# Patient Record
Sex: Male | Born: 1955 | Race: White | Hispanic: No | Marital: Single | Smoking: Current every day smoker
Health system: Southern US, Community
[De-identification: ages and names within clinical notes are randomized; demographics above are authoritative.]

## PROBLEM LIST (undated history)

## (undated) DIAGNOSIS — G621 Alcoholic polyneuropathy: Secondary | ICD-10-CM

## (undated) DIAGNOSIS — D696 Thrombocytopenia, unspecified: Secondary | ICD-10-CM

## (undated) DIAGNOSIS — F1020 Alcohol dependence, uncomplicated: Secondary | ICD-10-CM

## (undated) DIAGNOSIS — I219 Acute myocardial infarction, unspecified: Secondary | ICD-10-CM

## (undated) DIAGNOSIS — R768 Other specified abnormal immunological findings in serum: Secondary | ICD-10-CM

## (undated) DIAGNOSIS — J45909 Unspecified asthma, uncomplicated: Secondary | ICD-10-CM

## (undated) DIAGNOSIS — K7031 Alcoholic cirrhosis of liver with ascites: Secondary | ICD-10-CM

## (undated) DIAGNOSIS — I2510 Atherosclerotic heart disease of native coronary artery without angina pectoris: Secondary | ICD-10-CM

## (undated) HISTORY — DX: Alcoholic cirrhosis of liver with ascites: K70.31

## (undated) HISTORY — PX: CARDIAC CATHETERIZATION: SHX172

## (undated) HISTORY — PX: CARDIAC SURGERY: SHX584

## (undated) HISTORY — DX: Alcoholic polyneuropathy: G62.1

## (undated) HISTORY — DX: Other specified abnormal immunological findings in serum: R76.8

## (undated) HISTORY — PX: HERNIA REPAIR: SHX51

## (undated) HISTORY — DX: Alcohol dependence, uncomplicated: F10.20

---

## 2008-04-23 ENCOUNTER — Emergency Department: Payer: Self-pay | Admitting: Emergency Medicine

## 2011-03-31 ENCOUNTER — Emergency Department: Payer: Self-pay | Admitting: Emergency Medicine

## 2019-06-30 DIAGNOSIS — R197 Diarrhea, unspecified: Secondary | ICD-10-CM | POA: Diagnosis not present

## 2019-06-30 DIAGNOSIS — R638 Other symptoms and signs concerning food and fluid intake: Secondary | ICD-10-CM | POA: Diagnosis not present

## 2019-06-30 DIAGNOSIS — R3915 Urgency of urination: Secondary | ICD-10-CM | POA: Diagnosis not present

## 2020-03-02 DIAGNOSIS — Z03818 Encounter for observation for suspected exposure to other biological agents ruled out: Secondary | ICD-10-CM | POA: Diagnosis not present

## 2020-03-02 DIAGNOSIS — Z20822 Contact with and (suspected) exposure to covid-19: Secondary | ICD-10-CM | POA: Diagnosis not present

## 2020-10-09 ENCOUNTER — Emergency Department: Payer: BC Managed Care – PPO

## 2020-10-09 ENCOUNTER — Inpatient Hospital Stay
Admission: EM | Admit: 2020-10-09 | Discharge: 2020-10-11 | DRG: 433 | Disposition: A | Discharge status: Home / Self Care | Payer: BC Managed Care – PPO | Attending: Internal Medicine | Admitting: Internal Medicine

## 2020-10-09 ENCOUNTER — Other Ambulatory Visit: Payer: Self-pay

## 2020-10-09 DIAGNOSIS — I493 Ventricular premature depolarization: Secondary | ICD-10-CM | POA: Diagnosis present

## 2020-10-09 DIAGNOSIS — K7031 Alcoholic cirrhosis of liver with ascites: Principal | ICD-10-CM

## 2020-10-09 DIAGNOSIS — D696 Thrombocytopenia, unspecified: Secondary | ICD-10-CM | POA: Diagnosis not present

## 2020-10-09 DIAGNOSIS — E871 Hypo-osmolality and hyponatremia: Secondary | ICD-10-CM | POA: Diagnosis not present

## 2020-10-09 DIAGNOSIS — D539 Nutritional anemia, unspecified: Secondary | ICD-10-CM | POA: Diagnosis present

## 2020-10-09 DIAGNOSIS — I2510 Atherosclerotic heart disease of native coronary artery without angina pectoris: Secondary | ICD-10-CM | POA: Diagnosis present

## 2020-10-09 DIAGNOSIS — Z955 Presence of coronary angioplasty implant and graft: Secondary | ICD-10-CM | POA: Diagnosis not present

## 2020-10-09 DIAGNOSIS — J45909 Unspecified asthma, uncomplicated: Secondary | ICD-10-CM | POA: Diagnosis not present

## 2020-10-09 DIAGNOSIS — R188 Other ascites: Secondary | ICD-10-CM | POA: Diagnosis not present

## 2020-10-09 DIAGNOSIS — I252 Old myocardial infarction: Secondary | ICD-10-CM | POA: Diagnosis not present

## 2020-10-09 DIAGNOSIS — K7010 Alcoholic hepatitis without ascites: Secondary | ICD-10-CM | POA: Diagnosis present

## 2020-10-09 DIAGNOSIS — E876 Hypokalemia: Secondary | ICD-10-CM | POA: Diagnosis not present

## 2020-10-09 DIAGNOSIS — N62 Hypertrophy of breast: Secondary | ICD-10-CM | POA: Diagnosis present

## 2020-10-09 DIAGNOSIS — F1020 Alcohol dependence, uncomplicated: Secondary | ICD-10-CM | POA: Diagnosis not present

## 2020-10-09 DIAGNOSIS — J9811 Atelectasis: Secondary | ICD-10-CM | POA: Diagnosis not present

## 2020-10-09 DIAGNOSIS — E538 Deficiency of other specified B group vitamins: Secondary | ICD-10-CM | POA: Diagnosis not present

## 2020-10-09 DIAGNOSIS — R270 Ataxia, unspecified: Secondary | ICD-10-CM | POA: Diagnosis present

## 2020-10-09 DIAGNOSIS — R0602 Shortness of breath: Secondary | ICD-10-CM | POA: Diagnosis not present

## 2020-10-09 DIAGNOSIS — R000 Tachycardia, unspecified: Secondary | ICD-10-CM | POA: Diagnosis present

## 2020-10-09 DIAGNOSIS — Z20822 Contact with and (suspected) exposure to covid-19: Secondary | ICD-10-CM | POA: Diagnosis present

## 2020-10-09 DIAGNOSIS — K760 Fatty (change of) liver, not elsewhere classified: Secondary | ICD-10-CM | POA: Diagnosis present

## 2020-10-09 DIAGNOSIS — E512 Wernicke's encephalopathy: Secondary | ICD-10-CM

## 2020-10-09 DIAGNOSIS — R29818 Other symptoms and signs involving the nervous system: Secondary | ICD-10-CM | POA: Diagnosis not present

## 2020-10-09 HISTORY — DX: Unspecified asthma, uncomplicated: J45.909

## 2020-10-09 HISTORY — DX: Acute myocardial infarction, unspecified: I21.9

## 2020-10-09 LAB — RESP PANEL BY RT-PCR (FLU A&B, COVID) ARPGX2
Influenza A by PCR: NEGATIVE
Influenza B by PCR: NEGATIVE
SARS Coronavirus 2 by RT PCR: NEGATIVE

## 2020-10-09 LAB — CBC
HCT: 32.4 % — ABNORMAL LOW (ref 39.0–52.0)
Hemoglobin: 12 g/dL — ABNORMAL LOW (ref 13.0–17.0)
MCH: 39.9 pg — ABNORMAL HIGH (ref 26.0–34.0)
MCHC: 37 g/dL — ABNORMAL HIGH (ref 30.0–36.0)
MCV: 107.6 fL — ABNORMAL HIGH (ref 80.0–100.0)
Platelets: 70 10*3/uL — ABNORMAL LOW (ref 150–400)
RBC: 3.01 MIL/uL — ABNORMAL LOW (ref 4.22–5.81)
RDW: 12.5 % (ref 11.5–15.5)
WBC: 5.5 10*3/uL (ref 4.0–10.5)
nRBC: 0 % (ref 0.0–0.2)

## 2020-10-09 LAB — BRAIN NATRIURETIC PEPTIDE: B Natriuretic Peptide: 143.1 pg/mL — ABNORMAL HIGH (ref 0.0–100.0)

## 2020-10-09 LAB — BASIC METABOLIC PANEL
Anion gap: 15 (ref 5–15)
BUN: 18 mg/dL (ref 8–23)
CO2: 34 mmol/L — ABNORMAL HIGH (ref 22–32)
Calcium: 8.3 mg/dL — ABNORMAL LOW (ref 8.9–10.3)
Chloride: 80 mmol/L — ABNORMAL LOW (ref 98–111)
Creatinine, Ser: 1.11 mg/dL (ref 0.61–1.24)
GFR, Estimated: >60 mL/min (ref >60)
Glucose, Bld: 126 mg/dL — ABNORMAL HIGH (ref 70–99)
Potassium: 2.5 mmol/L — CL (ref 3.5–5.1)
Sodium: 129 mmol/L — ABNORMAL LOW (ref 135–145)

## 2020-10-09 LAB — TROPONIN I (HIGH SENSITIVITY)
Troponin I (High Sensitivity): 32 ng/L — ABNORMAL HIGH (ref <18)
Troponin I (High Sensitivity): 31 ng/L — ABNORMAL HIGH (ref <18)

## 2020-10-09 LAB — PROTIME-INR
INR: 1.1 (ref 0.8–1.2)
Prothrombin Time: 13.8 seconds (ref 11.4–15.2)

## 2020-10-09 LAB — APTT: aPTT: 27 seconds (ref 24–36)

## 2020-10-09 LAB — MAGNESIUM: Magnesium: 1.7 mg/dL (ref 1.7–2.4)

## 2020-10-09 LAB — HEPATIC FUNCTION PANEL
ALT: 49 U/L — ABNORMAL HIGH (ref 0–44)
AST: 96 U/L — ABNORMAL HIGH (ref 15–41)
Albumin: 2.4 g/dL — ABNORMAL LOW (ref 3.5–5.0)
Alkaline Phosphatase: 141 U/L — ABNORMAL HIGH (ref 38–126)
Bilirubin, Direct: 0.7 mg/dL — ABNORMAL HIGH (ref 0.0–0.2)
Indirect Bilirubin: 1.6 mg/dL — ABNORMAL HIGH (ref 0.3–0.9)
Total Bilirubin: 2.3 mg/dL — ABNORMAL HIGH (ref 0.3–1.2)
Total Protein: 5.5 g/dL — ABNORMAL LOW (ref 6.5–8.1)

## 2020-10-09 LAB — FOLATE: Folate: 5.3 ng/mL — ABNORMAL LOW (ref >5.9)

## 2020-10-09 LAB — ETHANOL: Alcohol, Ethyl (B): <10 mg/dL (ref <10)

## 2020-10-09 LAB — LIPASE, BLOOD: Lipase: 56 U/L — ABNORMAL HIGH (ref 11–51)

## 2020-10-09 MED ORDER — POTASSIUM CHLORIDE 10 MEQ/100ML IV SOLN
10.0000 meq | INTRAVENOUS | Status: AC
  Administered 2020-10-09 – 2020-10-10 (×4): 10 meq via INTRAVENOUS
  Filled 2020-10-09 (×3): qty 100

## 2020-10-09 MED ORDER — THIAMINE HCL 100 MG/ML IJ SOLN
500.0000 mg | Freq: Once | INTRAMUSCULAR | Status: DC
  Filled 2020-10-09: qty 6

## 2020-10-09 MED ORDER — FOLIC ACID 1 MG PO TABS
1.0000 mg | ORAL_TABLET | Freq: Once | ORAL | Status: AC
  Administered 2020-10-09: 1 mg via ORAL
  Filled 2020-10-09: qty 1

## 2020-10-09 MED ORDER — MAGNESIUM SULFATE 2 GM/50ML IV SOLN
2.0000 g | Freq: Once | INTRAVENOUS | Status: AC
  Administered 2020-10-09: 2 g via INTRAVENOUS
  Filled 2020-10-09: qty 50

## 2020-10-09 MED ORDER — THIAMINE HCL 100 MG/ML IJ SOLN
500.0000 mg | Freq: Once | INTRAVENOUS | Status: AC
  Administered 2020-10-10: 500 mg via INTRAVENOUS
  Filled 2020-10-09: qty 5

## 2020-10-09 MED ORDER — FOLIC ACID 1 MG PO TABS
1.0000 mg | ORAL_TABLET | Freq: Every day | ORAL | Status: DC
  Administered 2020-10-10 – 2020-10-11 (×2): 1 mg via ORAL
  Filled 2020-10-09 (×2): qty 1

## 2020-10-09 MED ORDER — IOHEXOL 350 MG/ML SOLN
80.0000 mL | Freq: Once | INTRAVENOUS | Status: AC | PRN
  Administered 2020-10-09: 80 mL via INTRAVENOUS

## 2020-10-09 MED ORDER — THIAMINE HCL 100 MG PO TABS
100.0000 mg | ORAL_TABLET | Freq: Every day | ORAL | Status: DC
  Administered 2020-10-10 – 2020-10-11 (×2): 100 mg via ORAL
  Filled 2020-10-09 (×2): qty 1

## 2020-10-09 MED ORDER — POTASSIUM CHLORIDE 10 MEQ/100ML IV SOLN
10.0000 meq | INTRAVENOUS | Status: AC
  Administered 2020-10-09 (×2): 10 meq via INTRAVENOUS
  Filled 2020-10-09 (×3): qty 100

## 2020-10-09 MED ORDER — ONDANSETRON HCL 4 MG/2ML IJ SOLN: 4.0000 mg | Freq: Four times a day (QID) | INTRAMUSCULAR | Status: DC | PRN

## 2020-10-09 MED ORDER — MAGNESIUM OXIDE -MG SUPPLEMENT 400 (240 MG) MG PO TABS
400.0000 mg | ORAL_TABLET | Freq: Two times a day (BID) | ORAL | Status: DC
  Administered 2020-10-09 – 2020-10-11 (×4): 400 mg via ORAL
  Filled 2020-10-09 (×4): qty 1

## 2020-10-09 MED ORDER — ONDANSETRON HCL 4 MG PO TABS: 4.0000 mg | ORAL_TABLET | Freq: Four times a day (QID) | ORAL | Status: DC | PRN

## 2020-10-09 NOTE — ED Notes (Signed)
Patient transported to X-ray 

## 2020-10-09 NOTE — ED Triage Notes (Signed)
Pt to ER via POV, reports he has had four heart attacks and he feels like he is having another one. Reports bilateral foot and hand numbness and tingling, being unable to keep down anything PO, and shortness of breath. Denies chest pain or discomfort. Reports difficulty seeing after going to the eye doctor this week, reports his eyes went black after dilation and he has poor vision at night.   States symptoms started 3-4 days ago.

## 2020-10-09 NOTE — ED Provider Notes (Signed)
[facility] Emergency Department Provider Note  ____________________________________________   Event Date/Time   First MD Initiated Contact with Patient 10/09/20 1504     (approximate)  I have reviewed the triage vital signs and the nursing notes.   HISTORY  Chief Complaint Shortness of Breath    HPI Paul Willis is a 65 y.o. male with asthma, prior heart attack who comes in with concerns for heart attack.  Patient reports a history of for heart attacks.  However he states that he is never had any stents put in and that 1 time they did the catheterization and just opened up a vessel without any other interventions.  He denies being on any medications at this time for his heart or having any history of heart surgery.  He reports that he just feels very short of breath when he has to walk upstairs that he feels really short of breath, constant, worse with walking, better at rest.  Denies any chest pain.  Reports a tingling sensation in his bilateral hands and bilateral feet.  States that every time he tries to eat he throws up.  Also reports that his vision has changed after having his eyes dilated and see the eye doctor where all the lights are appearing yellow rather than white.  Patient does admit to occasional alcohol use.  States that he used to have a problem with drinking but he states that he had quit a few months ago and started to drink intermittently again but has not had a drink for 4 days.  Denies any drugs.     Past Medical History:  Diagnosis Date   Acute MI (HCC)    Asthma     There are no problems to display for this patient.   Past Surgical History:  Procedure Laterality Date   CARDIAC SURGERY      Prior to Admission medications   Not on File    Allergies Patient has no allergy information on record.  No family history on file.  Social History Alcohol use, no drug   Review of Systems Constitutional: No fever/chills Eyes: No  visual changes. ENT: No sore throat. Cardiovascular: No chest pain Respiratory: Positive for SOB Gastrointestinal: No abdominal pain.  Positive for nausea, vomiting genitourinary: Negative for dysuria. Musculoskeletal: Negative for back pain. Skin: Negative for rash. Neurological: Negative for headaches, focal weakness.  Positive for tingling sensation in his hands and feet All other ROS negative ____________________________________________   PHYSICAL EXAM:  VITAL SIGNS: ED Triage Vitals [10/09/20 1445]  Enc Vitals Group     BP 105/65     Pulse Rate (!) 105     Resp 19     Temp 98.6 F (37 C)     Temp Source Oral     SpO2 100 %     Weight      Height 5\' 9"  (1.753 m)     Head Circumference      Peak Flow      Pain Score 0     Pain Loc      Pain Edu?      Excl. in GC?     Constitutional: Alert and oriented. Well appearing and in no acute distress. Eyes: Conjunctivae are normal. EOMI. pupils reactive bilaterally Head: Atraumatic. Nose: No congestion/rhinnorhea. Mouth/Throat: Mucous membranes are moist.   Neck: No stridor. Trachea Midline. FROM Cardiovascular: Tachycardia, regular rhythm. Grossly normal heart sounds.  Good peripheral circulation. Respiratory: Clear lungs, no increased work of breathing  Gastrointestinal: Soft and nontender. No distention. No abdominal bruits.  Musculoskeletal:  No joint effusions.  His fingers do look a little bit pale at the fingertips but he is unsure if that is his baseline.  He is got 1+ edema in his bilateral legs Neurologic:  Normal speech and language. No gross focal neurologic deficits are appreciated.  Equal strength in arms and legs but reports some tingling sensation in his hands and his feet.  Skin:  Skin is warm, dry and intact. No rash noted.  Scattered bruising noted on arms that patient states that just come up spontaneously Psychiatric: Mood and affect are normal. Speech and behavior are normal. GU: Deferred    ____________________________________________   LABS (all labs ordered are listed, but only abnormal results are displayed)  Labs Reviewed  CBC - Abnormal; Notable for the following components:      Result Value   RBC 3.01 (*)    Hemoglobin 12.0 (*)    HCT 32.4 (*)    MCV 107.6 (*)    MCH 39.9 (*)    MCHC 37.0 (*)    Platelets 70 (*)    All other components within normal limits  BASIC METABOLIC PANEL  TROPONIN I (HIGH SENSITIVITY)   ____________________________________________   ED ECG REPORT I, Concha Se, the attending physician, personally viewed and interpreted this ECG.  Sinus tachycardia rate of 110 without any ST elevations or T wave inversions, does have an occasional PVC, prolonged QTC ____________________________________________  RADIOLOGY Vela Prose, personally viewed and evaluated these images (plain radiographs) as part of my medical decision making, as well as reviewing the written report by the radiologist.  ED MD interpretation: No pneumonia  Official radiology report(s): DG Chest 2 View  Result Date: 10/09/2020 CLINICAL DATA:  Shortness of breath. EXAM: CHEST - 2 VIEW COMPARISON:  None. FINDINGS: The heart size and mediastinal contours are within normal limits. Both lungs are clear. The visualized skeletal structures are unremarkable. IMPRESSION: No active cardiopulmonary disease. Electronically Signed   By: Lupita Raider M.D.   On: 10/09/2020 15:18    ____________________________________________   PROCEDURES  Procedure(s) performed (including Critical Care):  .1-3 Lead EKG Interpretation Performed by: Concha Se, MD Authorized by: Concha Se, MD     Interpretation: abnormal     ECG rate:  100s   ECG rate assessment: tachycardic     Rhythm: sinus tachycardia     Ectopy: PVCs     Conduction: normal     ____________________________________________   INITIAL IMPRESSION / ASSESSMENT AND PLAN / ED COURSE   Paul Willis was  evaluated in Emergency Department on 10/09/2020 for the symptoms described in the history of present illness. He was evaluated in the context of the global COVID-19 pandemic, which necessitated consideration that the patient might be at risk for infection with the SARS-CoV-2 virus that causes COVID-19. Institutional protocols and algorithms that pertain to the evaluation of patients at risk for COVID-19 are in a state of rapid change based on information released by regulatory bodies including the CDC and federal and state organizations. These policies and algorithms were followed during the patient's care in the ED.     Is in with multiple symptoms including shortness of breath and leg swelling being the most significant on my examination.  He has normal oxygen levels but he is pretty tachycardic and concerned about the possibility of PE.  Will get CT scan to further evaluate.  However given all the  above nausea and vomiting with eating we will also get CT to make sure no evidence of any kind of cancer as well as CT head to evaluate for any Intracranial pathology especially with the numbness and tingling.  Labs are ordered to evaluate for any kind of liver cirrhosis, B12 deficiency, folate deficiency given his concern for alcohol use.  She will be kept on the cardiac monitor.  We will hold off on fluids at this time given my concern of the leg edema.   Patient's potassium is low at 2.5.  We will give 40 of IV potassium.  His chloride and sodium are also significantly low.  His hemoglobin is at 12 but he is an elevated MCV which could be consistent with B12 or folate deficiency.  His platelets are low at 70.  But his INR is normal which is reassuring.  CT imaging was reassuring although there is concern for new cirrhosis and ascites.  Patient denies having a history of cirrhosis.  I suspect this from his alcohol abuse.  I did alert him of this.  There is also concern for maybe a mass in his right breast I can  feel something palpated underneath his right nipple.  I recommend that he follow-up for outpatient ultrasound mammogram the patient expressed understanding.  With given all the tingling and numbness this could be from his folate is undetectable so I added on some folate.  However could be some Warnicke's given he does report some difficulties with ambulating secondary to all the numbness and tingling.  He not have any ocular findings but given the nutritional did deficiency and the ataxia we will start him on high-dose thiamine.  Magnesium is normal so do not need to replete that.  We will discussed the hospital team for admission.         ____________________________________________   FINAL CLINICAL IMPRESSION(S) / ED DIAGNOSES   Final diagnoses:  Alcoholic cirrhosis of liver with ascites (HCC)  Hypokalemia  Folate deficiency  Wernicke's disease     MEDICATIONS GIVEN DURING THIS VISIT:  Medications  potassium chloride 10 mEq in 100 mL IVPB (10 mEq Intravenous New Bag/Given 10/09/20 1742)  folic acid (FOLVITE) tablet 1 mg (has no administration in time range)  thiamine (B-1) injection 500 mg (has no administration in time range)  iohexol (OMNIPAQUE) 350 MG/ML injection 80 mL (80 mLs Intravenous Contrast Given 10/09/20 1720)     ED Discharge Orders     None        Note:  This document was prepared using Dragon voice recognition software and may include unintentional dictation errors.   Concha Se, MD 10/09/20 [phone]

## 2020-10-09 NOTE — H&P (Signed)
History and Physical   Paul Willis EGB:[id number] DOB: Jul 27, 1955 DOA: 10/09/2020  Referring MD/NP/PA: Dr. Fuller Plan  PCP: System, Provider Not In   Outpatient Specialists: None  Patient coming from: Home  Chief Complaint: Shortness of breath  HPI: Paul Willis is a 65 y.o. male with medical history significant of alcohol abuse, asthma, coronary artery disease who presents to the ER with some tingling sensations in his hands legs as well as shortness of breath.  Patient has history of coronary artery disease with cardiac cath before with stent.  Currently not on any medications.  Also history of asthma.  Patient came to the ER was seen and evaluated and appears to be having liver cirrhosis.  Patient said he quit drinking 4 days ago.  He previously quit drinking then went back to drinking.  He has since been getting exertional dyspnea.  Usually when he climbs up stairs.  He noticed increasing leg abdominal swellings.  He has been also feeling weak in general.  Patient also has noted visual changes lately.  At this point patient being admitted with alcoholic cirrhosis but also worrisome for early Warnicke's disease.  He was additionally noted to have significant hypokalemia with potassium of 2.5.  He is being admitted to the hospital for further evaluation and treatment.  ED Course: Temperature is 98.6 blood pressure 105/65, pulse 105 respirate 20 oxygen sat 88% on room air.  White count is 5.5 hemoglobin 12.0 platelet count of 70.  Sodium 129 potassium 2.5 chloride 80s CO2 34 BUN 18 creatinine 1.11 and calcium 8.3.  Total bilirubin of 2.3 lipase 56 alkaline phos of 141.  Albumin 2.4.  AST 96 ALT 49.  BNP 143.  COVID-19 is an influenza screen negative.  Alcohol level less than 10.  CT head without contrast is negative.  CT angiogram of of the chest showed no evidence of PE.  CT abdomen and pelvis shows profound hepatic steatosis with hypertrophy of the left hepatic lobe likely indicating the cirrhosis.   Also small amount of ascites.  Asymmetric right lateral regular nodular density suspicious cirrhosis  Review of Systems: As per HPI otherwise 10 point review of systems negative.    Past Medical History:  Diagnosis Date   Acute MI (HCC)    Asthma     Past Surgical History:  Procedure Laterality Date   CARDIAC SURGERY       has no history on file for tobacco use, alcohol use, and drug use.  Not on File  No family history on file.   Prior to Admission medications   Not on File    Physical Exam: Vitals:   10/09/20 1543 10/09/20 1630 10/09/20 1712 10/09/20 1744  BP: 124/67 122/60 119/68 110/64  Pulse: (!) 102 97 93 95  Resp: 16 20 20 12   Temp:      TempSrc:      SpO2: 100% 97% 98% 98%  Height:          Constitutional: Chronically ill looking, weak Vitals:   10/09/20 1543 10/09/20 1630 10/09/20 1712 10/09/20 1744  BP: 124/67 122/60 119/68 110/64  Pulse: (!) 102 97 93 95  Resp: 16 20 20 12   Temp:      TempSrc:      SpO2: 100% 97% 98% 98%  Height:       Eyes: PERRL, lids and conjunctivae jaundiced ENMT: Mucous membranes are moist. Posterior pharynx clear of any exudate or lesions.Normal dentition.  Neck: normal, supple, no masses, no thyromegaly Respiratory: clear  to auscultation bilaterally, no wheezing, diffuse bilateral crackles. Normal respiratory effort. No accessory muscle use.  Cardiovascular: Sinus tachycardia, no murmurs / rubs / gallops. No extremity edema. 2+ pedal pulses. No carotid bruits.  Abdomen: Distended, mild shifting but no tenderness, no masses palpated. No hepatosplenomegaly. Bowel sounds positive.  Musculoskeletal: no clubbing / cyanosis. No joint deformity upper and lower extremities. Good ROM, no contractures. Normal muscle tone.  Skin: no rashes, lesions, ulcers. No induration Neurologic: CN 2-12 grossly intact. Sensation intact, DTR normal. Strength 5/5 in all 4.  Psychiatric: Normal judgment and insight. Alert and oriented x 3. Normal  mood.     Labs on Admission: I have personally reviewed following labs and imaging studies  CBC: Recent Labs  Lab 10/09/20 1448  WBC 5.5  HGB 12.0*  HCT 32.4*  MCV 107.6*  PLT 70*   Basic Metabolic Panel: Recent Labs  Lab 10/09/20 1448  NA 129*  K 2.5*  CL 80*  CO2 34*  GLUCOSE 126*  BUN 18  CREATININE 1.11  CALCIUM 8.3*  MG 1.7   GFR: CrCl cannot be calculated (Unknown ideal weight.). Liver Function Tests: Recent Labs  Lab 10/09/20 1448  AST 96*  ALT 49*  ALKPHOS 141*  BILITOT 2.3*  PROT 5.5*  ALBUMIN 2.4*   Recent Labs  Lab 10/09/20 1448  LIPASE 56*   No results for input(s): AMMONIA in the last 168 hours. Coagulation Profile: Recent Labs  Lab 10/09/20 1531  INR 1.1   Cardiac Enzymes: No results for input(s): CKTOTAL, CKMB, CKMBINDEX, TROPONINI in the last 168 hours. BNP (last 3 results) No results for input(s): PROBNP in the last 8760 hours. HbA1C: No results for input(s): HGBA1C in the last 72 hours. CBG: No results for input(s): GLUCAP in the last 168 hours. Lipid Profile: No results for input(s): CHOL, HDL, LDLCALC, TRIG, CHOLHDL, LDLDIRECT in the last 72 hours. Thyroid Function Tests: No results for input(s): TSH, T4TOTAL, FREET4, T3FREE, THYROIDAB in the last 72 hours. Anemia Panel: Recent Labs    10/09/20 1531  FOLATE 5.3*   Urine analysis: No results found for: COLORURINE, APPEARANCEUR, LABSPEC, PHURINE, GLUCOSEU, HGBUR, BILIRUBINUR, KETONESUR, PROTEINUR, UROBILINOGEN, NITRITE, LEUKOCYTESUR Sepsis Labs: @LABRCNTIP (procalcitonin:4,lacticidven:4) ) Recent Results (from the past 240 hour(s))  Resp Panel by RT-PCR (Flu A&B, Covid) Nasopharyngeal Swab     Status: None   Collection Time: 10/09/20  3:31 PM   Specimen: Nasopharyngeal Swab; Nasopharyngeal(NP) swabs in vial transport medium  Result Value Ref Range Status   SARS Coronavirus 2 by RT PCR NEGATIVE NEGATIVE Final    Comment: (NOTE) SARS-CoV-2 target nucleic acids are NOT  DETECTED.  The SARS-CoV-2 RNA is generally detectable in upper respiratory specimens during the acute phase of infection. The lowest concentration of SARS-CoV-2 viral copies this assay can detect is 138 copies/mL. A negative result does not preclude SARS-Cov-2 infection and should not be used as the sole basis for treatment or other patient management decisions. A negative result may occur with  improper specimen collection/handling, submission of specimen other than nasopharyngeal swab, presence of viral mutation(s) within the areas targeted by this assay, and inadequate number of viral copies(<138 copies/mL). A negative result must be combined with clinical observations, patient history, and epidemiological information. The expected result is Negative.  Fact Sheet for Patients:  10/11/20  Fact Sheet for Healthcare Providers:  BloggerCourse.com  This test is no t yet approved or cleared by the [employer] FDA and  has been authorized for detection and/or diagnosis of SARS-CoV-2 by  FDA under an Emergency Use Authorization (EUA). This EUA will remain  in effect (meaning this test can be used) for the duration of the COVID-19 declaration under Section 564(b)(1) of the Act, 21 U.S.C.section 360bbb-3(b)(1), unless the authorization is terminated  or revoked sooner.       Influenza A by PCR NEGATIVE NEGATIVE Final   Influenza B by PCR NEGATIVE NEGATIVE Final    Comment: (NOTE) The Xpert Xpress SARS-CoV-2/FLU/RSV plus assay is intended as an aid in the diagnosis of influenza from Nasopharyngeal swab specimens and should not be used as a sole basis for treatment. Nasal washings and aspirates are unacceptable for Xpert Xpress SARS-CoV-2/FLU/RSV testing.  Fact Sheet for Patients: BloggerCourse.com  Fact Sheet for Healthcare Providers: SeriousBroker.it  This test is not yet  approved or cleared by the [place] FDA and has been authorized for detection and/or diagnosis of SARS-CoV-2 by FDA under an Emergency Use Authorization (EUA). This EUA will remain in effect (meaning this test can be used) for the duration of the COVID-19 declaration under Section 564(b)(1) of the Act, 21 U.S.C. section 360bbb-3(b)(1), unless the authorization is terminated or revoked.  Performed at [facility], [address] [place], [place] [address]      Radiological Exams on Admission: DG Chest 2 View  Result Date: 10/09/2020 CLINICAL DATA:  Shortness of breath. EXAM: CHEST - 2 VIEW COMPARISON:  None. FINDINGS: The heart size and mediastinal contours are within normal limits. Both lungs are clear. The visualized skeletal structures are unremarkable. IMPRESSION: No active cardiopulmonary disease. Electronically Signed   By: Lupita Raider M.D.   On: 10/09/2020 15:18   CT HEAD WO CONTRAST ( )  Result Date: 10/09/2020 CLINICAL DATA:  Acute neuro deficit. EXAM: CT HEAD WITHOUT CONTRAST TECHNIQUE: Contiguous axial images were obtained from the base of the skull through the vertex without intravenous contrast. COMPARISON:  None. FINDINGS: Brain: No evidence of acute infarction, hemorrhage, hydrocephalus, extra-axial collection or mass lesion/mass effect. Vascular: No hyperdense vessel or unexpected calcification. Skull: Normal. Negative for fracture or focal lesion. Sinuses/Orbits: No acute finding. Other: None. IMPRESSION: No acute intracranial abnormality seen. Electronically Signed   By: Lupita Raider M.D.   On: 10/09/2020 17:46   CT Angio Chest PE W and/or Wo Contrast  Result Date: 10/09/2020 CLINICAL DATA:  PE suspected, high prob; Abdominal distension EXAM: CT ANGIOGRAPHY CHEST CT ABDOMEN AND PELVIS WITH CONTRAST TECHNIQUE: Multidetector CT imaging of the chest was performed using the standard protocol during bolus administration of intravenous contrast. Multiplanar  CT image reconstructions and MIPs were obtained to evaluate the vascular anatomy. Multidetector CT imaging of the abdomen and pelvis was performed using the standard protocol during bolus administration of intravenous contrast. CONTRAST:  49mL OMNIPAQUE IOHEXOL 350 MG/ML SOLN COMPARISON:  None. FINDINGS: CTA CHEST FINDINGS Cardiovascular: Satisfactory opacification of the pulmonary arteries to the segmental level. No evidence of pulmonary embolism. Normal heart size. No pericardial effusion. LEFT-sided coronary artery atherosclerotic calcifications. Mediastinum/Nodes: No axillary or mediastinal adenopathy. Thyroid is unremarkable. Lungs/Pleura: Multifocal endobronchial and tracheal debris with mild bronchial wall thickening and plugging at the bases with mild RIGHT lower lobe atelectasis. Mild centrilobular emphysema. No pleural effusion or pneumothorax. Musculoskeletal: Asymmetric RIGHT breast retroareolar nodule measuring 14 mm. Review of the MIP images confirms the above findings. CT ABDOMEN and PELVIS FINDINGS Hepatobiliary: Profound hepatic steatosis. Hypertrophy of the LEFT liver. Focal fatty sparing of the gallbladder fossa. Mucosal enhancement of the gallbladder without gallbladder wall thickening or visualized cholelithiasis. Portal  vein is patent. No intrahepatic or extrahepatic biliary ductal dilation. Pancreas: No pancreatic fat stranding. Coarse calcification of the pancreatic body likely reflecting sequela of remote prior pancreatitis. Spleen: Spleen is at the upper limits of normal in size. Adrenals/Urinary Tract: Adrenal glands are unremarkable. No hydronephrosis. Kidneys enhance symmetrically. No obstructive nephrolithiasis. Bladder is decompressed. Stomach/Bowel: No evidence of bowel obstruction. Appendix is normal. Stomach is unremarkable. Small bowel is mildly edematous in appearance, likely reactive in the setting of ascites. Vascular/Lymphatic: Atherosclerotic calcifications of the aorta. No  suspicious lymphadenopathy. Reproductive: Process present. Other: Small to moderate volume ascites. Musculoskeletal: No acute osseous abnormality. Degenerative changes most pronounced at L2-3. Limbus vertebra of L4. Review of the MIP images confirms the above findings. IMPRESSION: 1. No evidence of pulmonary embolism. 2. Profound hepatic steatosis with hypertrophy of the LEFT hepatic lobe likely reflecting a degree of underlying cirrhosis. There is small to moderate volume ascites. 3. Bronchial wall thickening with multifocal endobronchial debris, likely infectious or inflammatory in etiology. 4. Asymmetric RIGHT retroareolar nodular density likely reflects nodular gynecomastia given hepatic steatosis/likely underlying cirrhosis. However given nodular appearance, recommend correlation with physical exam and potential further evaluation with nonemergent outpatient diagnostic mammogram and ultrasound as deemed necessary. Aortic Atherosclerosis (ICD10-I70.0) and Emphysema (ICD10-J43.9). Electronically Signed   By: Meda Klinefelter M.D.   On: 10/09/2020 18:04   CT ABDOMEN PELVIS W CONTRAST  Result Date: 10/09/2020 CLINICAL DATA:  PE suspected, high prob; Abdominal distension EXAM: CT ANGIOGRAPHY CHEST CT ABDOMEN AND PELVIS WITH CONTRAST TECHNIQUE: Multidetector CT imaging of the chest was performed using the standard protocol during bolus administration of intravenous contrast. Multiplanar CT image reconstructions and MIPs were obtained to evaluate the vascular anatomy. Multidetector CT imaging of the abdomen and pelvis was performed using the standard protocol during bolus administration of intravenous contrast. CONTRAST:  80mL OMNIPAQUE IOHEXOL 350 MG/ML SOLN COMPARISON:  None. FINDINGS: CTA CHEST FINDINGS Cardiovascular: Satisfactory opacification of the pulmonary arteries to the segmental level. No evidence of pulmonary embolism. Normal heart size. No pericardial effusion. LEFT-sided coronary artery  atherosclerotic calcifications. Mediastinum/Nodes: No axillary or mediastinal adenopathy. Thyroid is unremarkable. Lungs/Pleura: Multifocal endobronchial and tracheal debris with mild bronchial wall thickening and plugging at the bases with mild RIGHT lower lobe atelectasis. Mild centrilobular emphysema. No pleural effusion or pneumothorax. Musculoskeletal: Asymmetric RIGHT breast retroareolar nodule measuring 14 mm. Review of the MIP images confirms the above findings. CT ABDOMEN and PELVIS FINDINGS Hepatobiliary: Profound hepatic steatosis. Hypertrophy of the LEFT liver. Focal fatty sparing of the gallbladder fossa. Mucosal enhancement of the gallbladder without gallbladder wall thickening or visualized cholelithiasis. Portal vein is patent. No intrahepatic or extrahepatic biliary ductal dilation. Pancreas: No pancreatic fat stranding. Coarse calcification of the pancreatic body likely reflecting sequela of remote prior pancreatitis. Spleen: Spleen is at the upper limits of normal in size. Adrenals/Urinary Tract: Adrenal glands are unremarkable. No hydronephrosis. Kidneys enhance symmetrically. No obstructive nephrolithiasis. Bladder is decompressed. Stomach/Bowel: No evidence of bowel obstruction. Appendix is normal. Stomach is unremarkable. Small bowel is mildly edematous in appearance, likely reactive in the setting of ascites. Vascular/Lymphatic: Atherosclerotic calcifications of the aorta. No suspicious lymphadenopathy. Reproductive: Process present. Other: Small to moderate volume ascites. Musculoskeletal: No acute osseous abnormality. Degenerative changes most pronounced at L2-3. Limbus vertebra of L4. Review of the MIP images confirms the above findings. IMPRESSION: 1. No evidence of pulmonary embolism. 2. Profound hepatic steatosis with hypertrophy of the LEFT hepatic lobe likely reflecting a degree of underlying cirrhosis. There is small to moderate volume  ascites. 3. Bronchial wall thickening with  multifocal endobronchial debris, likely infectious or inflammatory in etiology. 4. Asymmetric RIGHT retroareolar nodular density likely reflects nodular gynecomastia given hepatic steatosis/likely underlying cirrhosis. However given nodular appearance, recommend correlation with physical exam and potential further evaluation with nonemergent outpatient diagnostic mammogram and ultrasound as deemed necessary. Aortic Atherosclerosis (ICD10-I70.0) and Emphysema (ICD10-J43.9). Electronically Signed   By: Meda Klinefelter M.D.   On: 10/09/2020 18:04    EKG: Independently reviewed.  Sinus tachycardia  Assessment/Plan Principal Problem:   Acute alcoholic liver disease Active Problems:   Hypokalemia   Asthma   CAD (coronary artery disease)   Alcoholism (HCC)     #1 acute alcohol liver disease: Patient will receive counseling.  May require GI consultation.  At this point supportive care only.  Patient will have replacement of thiamine and folic acid at this point.  Mild ascites only.  No requirement for paracentesis.  #2 severe hypokalemia: Replete potassium by IV and oral route.  Replete magnesium  #3 history of asthma: No exacerbation.  Continue to monitor  #4 coronary artery disease: Stable.  Patient has not been on any treatment.  Does not appear to have acute decompensation.  #5 history of alcoholism: Continue thiamine and folic acid with supportive care  #6 thrombocytopenia: Continue to monitor liver functions.  Secondary to cirrhosis.  #7 tingling sensation: Worrisome for some neurologic symptoms from chronic alcoholism.  #8 hyponatremia: Continue to monitor her sodium levels.   DVT prophylaxis: SCD Code Status: Full code Family Communication: No family at bedside Disposition Plan: Home Consults called: None but may require GI consult Admission status: Inpatient  Severity of Illness: The appropriate patient status for this patient is INPATIENT. Inpatient status is judged to be  reasonable and necessary in order to provide the required intensity of service to ensure the patient's safety. The patient's presenting symptoms, physical exam findings, and initial radiographic and laboratory data in the context of their chronic comorbidities is felt to place them at high risk for further clinical deterioration. Furthermore, it is not anticipated that the patient will be medically stable for discharge from the hospital within 2 midnights of admission. The following factors support the patient status of inpatient.   " The patient's presenting symptoms include weakness with tingling. " The worrisome physical exam findings include symptoms of chronic liver disease. " The initial radiographic and laboratory data are worrisome because of CT findings of liver cirrhosis. " The chronic co-morbidities include alcoholism.   * I certify that at the point of admission it is my clinical judgment that the patient will require inpatient hospital care spanning beyond 2 midnights from the point of admission due to high intensity of service, high risk for further deterioration and high frequency of surveillance required.Lonia Blood MD [facility] Pager [phone]  If 7PM-7AM, please contact night-coverage [email or web address] Password [facility]  10/09/2020, 6:21 PM

## 2020-10-10 ENCOUNTER — Encounter: Payer: Self-pay | Admitting: Internal Medicine

## 2020-10-10 LAB — CBC
HCT: 29.6 % — ABNORMAL LOW (ref 39.0–52.0)
Hemoglobin: 10.8 g/dL — ABNORMAL LOW (ref 13.0–17.0)
MCH: 39.3 pg — ABNORMAL HIGH (ref 26.0–34.0)
MCHC: 36.5 g/dL — ABNORMAL HIGH (ref 30.0–36.0)
MCV: 107.6 fL — ABNORMAL HIGH (ref 80.0–100.0)
Platelets: 43 10*3/uL — ABNORMAL LOW (ref 150–400)
RBC: 2.75 MIL/uL — ABNORMAL LOW (ref 4.22–5.81)
RDW: 12.5 % (ref 11.5–15.5)
WBC: 2.3 10*3/uL — ABNORMAL LOW (ref 4.0–10.5)
nRBC: 0 % (ref 0.0–0.2)

## 2020-10-10 LAB — COMPREHENSIVE METABOLIC PANEL
ALT: 41 U/L (ref 0–44)
AST: 77 U/L — ABNORMAL HIGH (ref 15–41)
Albumin: 2.2 g/dL — ABNORMAL LOW (ref 3.5–5.0)
Alkaline Phosphatase: 126 U/L (ref 38–126)
Anion gap: 7 (ref 5–15)
BUN: 13 mg/dL (ref 8–23)
CO2: 39 mmol/L — ABNORMAL HIGH (ref 22–32)
Calcium: 7.8 mg/dL — ABNORMAL LOW (ref 8.9–10.3)
Chloride: 84 mmol/L — ABNORMAL LOW (ref 98–111)
Creatinine, Ser: 0.88 mg/dL (ref 0.61–1.24)
GFR, Estimated: >60 mL/min (ref >60)
Glucose, Bld: 89 mg/dL (ref 70–99)
Potassium: 2.9 mmol/L — ABNORMAL LOW (ref 3.5–5.1)
Sodium: 130 mmol/L — ABNORMAL LOW (ref 135–145)
Total Bilirubin: 2.2 mg/dL — ABNORMAL HIGH (ref 0.3–1.2)
Total Protein: 4.9 g/dL — ABNORMAL LOW (ref 6.5–8.1)

## 2020-10-10 LAB — VITAMIN B12: Vitamin B-12: 604 pg/mL (ref 180–914)

## 2020-10-10 LAB — HIV ANTIBODY (ROUTINE TESTING W REFLEX): HIV Screen 4th Generation wRfx: NONREACTIVE

## 2020-10-10 MED ORDER — TRAZODONE HCL 50 MG PO TABS
25.0000 mg | ORAL_TABLET | Freq: Every evening | ORAL | Status: DC | PRN
  Administered 2020-10-10: 25 mg via ORAL
  Filled 2020-10-10: qty 1

## 2020-10-10 MED ORDER — POTASSIUM CHLORIDE CRYS ER 20 MEQ PO TBCR
40.0000 meq | EXTENDED_RELEASE_TABLET | ORAL | Status: AC
Start: 2020-10-10 — End: 2020-10-10
  Administered 2020-10-10 (×2): 40 meq via ORAL
  Filled 2020-10-10 (×2): qty 2

## 2020-10-10 NOTE — Progress Notes (Signed)
PROGRESS NOTE    Paul Willis  ZOX:[id number] DOB: 02-05-1955 DOA: 10/09/2020 PCP: System, Provider Not In     Brief Narrative:  Paul Willis is a 65 y.o. male with medical history significant of alcohol abuse, asthma, coronary artery disease who presents to the ER with some tingling sensations in his hands legs as well as shortness of breath.  Patient has history of coronary artery disease with cardiac cath with stent.  Currently not on any medications.  Patient came to the ER was seen and evaluated and appears to be having liver cirrhosis.  Patient said he quit drinking 4 days ago.  He previously quit drinking then went back to drinking.  He has since been getting exertional dyspnea.  Usually when he climbs up stairs.  He noticed increasing leg abdominal swellings.  He has been also feeling weak in general.  Patient also has noted visual changes lately.  At this point patient being admitted with alcoholic cirrhosis but also worrisome for early Wernicke's disease.  He was additionally noted to have significant hypokalemia with potassium of 2.5.   New events last 24 hours / Subjective: This morning patient states that he feels back to his baseline.  Denies any shortness of breath, nausea, vomiting.  Has no other symptoms today.  We discussed his chronic alcohol intake, states that he has been drinking since he was 65 years old.  He normally drinks 1/5 of liquor as well as 2 or 3 beers daily.  His last drink was about 5 days ago.  Assessment & Plan:   Principal Problem:   Acute alcoholic liver disease Active Problems:   Hypokalemia   Asthma   CAD (coronary artery disease)   Alcoholism (HCC)   Alcoholic cirrhosis -CT abdomen pelvis revealed hepatic steatosis with hypertrophy of the left hepatic lobe reflecting underlying cirrhosis, small to moderate ascites -LFTs improving with supportive care -Patient will need to set up with outpatient GI  Lcohol abuse -TOC consulted for alcohol abuse  counseling -Out of window for withdrawal   Chronic macrocytic anemia  -Folate deficiency - replace   Hypokalemia -Replace, trend  CAD -Stable  Paresthesias -CT head negative -Check thiamine   Right retroareolar nodular density, 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      Family Communication: None at bedside Disposition Plan:  Status is: Inpatient  Remains inpatient appropriate because:Inpatient level of care appropriate due to severity of illness  Dispo: The patient is from: Home              Anticipated d/c is to: Home              Patient currently is not medically stable to d/c.   Difficult to place patient No      Consultants:  None  Procedures:  None  Antimicrobials:  Anti-infectives (From admission, onward)    None        Objective: Vitals:   10/10/20 0755 10/10/20 1021 10/10/20 1024 10/10/20  1028  BP: (!) 84/60 117/72 92/76 92/76   Pulse: 98 98 93 93  Resp: 16 18 18 18   Temp: 98.9 F (37.2 C) 98.9 F (37.2 C)  98.9 F (37.2 C)  TempSrc:    Oral  SpO2: 94%  100%   Weight:    72.8 kg  Height:  5\' 9"  (1.753 m)  5\' 9"  (1.753 m)    Intake/Output Summary (Last 24 hours) at 10/10/2020 1253 Last data filed at 10/10/2020 1013 Gross per 24 hour  Intake 360 ml  Output 300 ml  Net 60 ml   Filed Weights   10/10/20 1028  Weight: 72.8 kg    Examination:  General exam: Appears calm and comfortable  Respiratory system: Clear to auscultation. Respiratory effort normal. No respiratory distress. No conversational dyspnea.  Cardiovascular system: S1 & S2 heard, RRR. No murmurs. No  pedal edema. Gastrointestinal system: Abdomen is nondistended, soft and nontender. Normal bowel sounds heard. Central nervous system: Alert and oriented. No focal neurological deficits. Speech clear.  Extremities: Symmetric in appearance  Skin: No rashes, lesions or ulcers on exposed skin  Psychiatry: Judgement and insight appear normal. Mood & affect appropriate.   Data Reviewed: I have personally reviewed following labs and imaging studies  CBC: Recent Labs  Lab 10/09/20 1448 10/10/20 0514  WBC 5.5 2.3*  HGB 12.0* 10.8*  HCT 32.4* 29.6*  MCV 107.6* 107.6*  PLT 70* 43*   Basic Metabolic Panel: Recent Labs  Lab 10/09/20 1448 10/10/20 0514  NA 129* 130*  K 2.5* 2.9*  CL 80* 84*  CO2 34* 39*  GLUCOSE 126* 89  BUN 18 13  CREATININE 1.11 0.88  CALCIUM 8.3* 7.8*  MG 1.7  --    GFR: Estimated Creatinine Clearance: 87 mL/min (by C-G formula based on SCr of 0.88 mg/dL). Liver Function Tests: Recent Labs  Lab 10/09/20 1448 10/10/20 0514  AST 96* 77*  ALT 49* 41  ALKPHOS 141* 126  BILITOT 2.3* 2.2*  PROT 5.5* 4.9*  ALBUMIN 2.4* 2.2*   Recent Labs  Lab 10/09/20 1448  LIPASE 56*   No results for input(s): AMMONIA in the last 168 hours. Coagulation Profile: Recent Labs  Lab 10/09/20 1531  INR 1.1   Cardiac Enzymes: No results for input(s): CKTOTAL, CKMB, CKMBINDEX, TROPONINI in the last 168 hours. BNP (last 3 results) No results for input(s): PROBNP in the last 8760 hours. HbA1C: No results for input(s): HGBA1C in the last 72 hours. CBG: No results for input(s): GLUCAP in the last 168 hours. Lipid Profile: No results for input(s): CHOL, HDL, LDLCALC, TRIG, CHOLHDL, LDLDIRECT in the last 72 hours. Thyroid Function Tests: No results for input(s): TSH, T4TOTAL, FREET4, T3FREE, THYROIDAB in the last 72 hours. Anemia Panel: Recent Labs    10/09/20 1531  VITAMINB12 604  FOLATE 5.3*   Sepsis Labs: No results for input(s): PROCALCITON, LATICACIDVEN in the  last 168 hours.  Recent Results (from the past 240 hour(s))  Resp Panel by RT-PCR (Flu A&B, Covid) Nasopharyngeal Swab     Status: None   Collection Time: 10/09/20  3:31 PM   Specimen: Nasopharyngeal Swab; Nasopharyngeal(NP) swabs in vial transport medium  Result Value Ref Range Status   SARS Coronavirus 2 by RT PCR NEGATIVE NEGATIVE Final    Comment: (NOTE) SARS-CoV-2 target nucleic acids are NOT DETECTED.  The SARS-CoV-2 RNA is generally detectable in upper respiratory specimens during the acute phase of infection. The lowest concentration of SARS-CoV-2 viral copies this assay can detect is  138 copies/mL. A negative result does not preclude SARS-Cov-2 infection and should not be used as the sole basis for treatment or other patient management decisions. A negative result may occur with  improper specimen collection/handling, submission of specimen other than nasopharyngeal swab, presence of viral mutation(s) within the areas targeted by this assay, and inadequate number of viral copies(<138 copies/mL). A negative result must be combined with clinical observations, patient history, and epidemiological information. The expected result is Negative.  Fact Sheet for Patients:  BloggerCourse.com  Fact Sheet for Healthcare Providers:  SeriousBroker.it  This test is no t yet approved or cleared by the [place] FDA and  has been authorized for detection and/or diagnosis of SARS-CoV-2 by FDA under an Emergency Use Authorization (EUA). This EUA will remain  in effect (meaning this test can be used) for the duration of the COVID-19 declaration under Section 564(b)(1) of the Act, 21 U.S.C.section 360bbb-3(b)(1), unless the authorization is terminated  or revoked sooner.       Influenza A by PCR NEGATIVE NEGATIVE Final   Influenza B by PCR NEGATIVE NEGATIVE Final    Comment: (NOTE) The Xpert Xpress SARS-CoV-2/FLU/RSV plus assay is  intended as an aid in the diagnosis of influenza from Nasopharyngeal swab specimens and should not be used as a sole basis for treatment. Nasal washings and aspirates are unacceptable for Xpert Xpress SARS-CoV-2/FLU/RSV testing.  Fact Sheet for Patients: BloggerCourse.com  Fact Sheet for Healthcare Providers: SeriousBroker.it  This test is not yet approved or cleared by the [place] FDA and has been authorized for detection and/or diagnosis of SARS-CoV-2 by FDA under an Emergency Use Authorization (EUA). This EUA will remain in effect (meaning this test can be used) for the duration of the COVID-19 declaration under Section 564(b)(1) of the Act, 21 U.S.C. section 360bbb-3(b)(1), unless the authorization is terminated or revoked.  Performed at [facility], [address], [place], [place] [address]       Radiology Studies: DG Chest 2 View  Result Date: 10/09/2020 CLINICAL DATA:  Shortness of breath. EXAM: CHEST - 2 VIEW COMPARISON:  None. FINDINGS: The heart size and mediastinal contours are within normal limits. Both lungs are clear. The visualized skeletal structures are unremarkable. IMPRESSION: No active cardiopulmonary disease. Electronically Signed   By: Lupita Raider M.D.   On: 10/09/2020 15:18   CT HEAD WO CONTRAST ( )  Result Date: 10/09/2020 CLINICAL DATA:  Acute neuro deficit. EXAM: CT HEAD WITHOUT CONTRAST TECHNIQUE: Contiguous axial images were obtained from the base of the skull through the vertex without intravenous contrast. COMPARISON:  None. FINDINGS: Brain: No evidence of acute infarction, hemorrhage, hydrocephalus, extra-axial collection or mass lesion/mass effect. Vascular: No hyperdense vessel or unexpected calcification. Skull: Normal. Negative for fracture or focal lesion. Sinuses/Orbits: No acute finding. Other: None. IMPRESSION: No acute intracranial abnormality seen. Electronically Signed    By: Lupita Raider M.D.   On: 10/09/2020 17:46   CT Angio Chest PE W and/or Wo Contrast  Result Date: 10/09/2020 CLINICAL DATA:  PE suspected, high prob; Abdominal distension EXAM: CT ANGIOGRAPHY CHEST CT ABDOMEN AND PELVIS WITH CONTRAST TECHNIQUE: Multidetector CT imaging of the chest was performed using the standard protocol during bolus administration of intravenous contrast. Multiplanar CT image reconstructions and MIPs were obtained to evaluate the vascular anatomy. Multidetector CT imaging of the abdomen and pelvis was performed using the standard protocol during bolus administration of intravenous contrast. CONTRAST:  31mL OMNIPAQUE IOHEXOL 350 MG/ML SOLN COMPARISON:  None. FINDINGS:  CTA CHEST FINDINGS Cardiovascular: Satisfactory opacification of the pulmonary arteries to the segmental level. No evidence of pulmonary embolism. Normal heart size. No pericardial effusion. LEFT-sided coronary artery atherosclerotic calcifications. Mediastinum/Nodes: No axillary or mediastinal adenopathy. Thyroid is unremarkable. Lungs/Pleura: Multifocal endobronchial and tracheal debris with mild bronchial wall thickening and plugging at the bases with mild RIGHT lower lobe atelectasis. Mild centrilobular emphysema. No pleural effusion or pneumothorax. Musculoskeletal: Asymmetric RIGHT breast retroareolar nodule measuring 14 mm. Review of the MIP images confirms the above findings. CT ABDOMEN and PELVIS FINDINGS Hepatobiliary: Profound hepatic steatosis. Hypertrophy of the LEFT liver. Focal fatty sparing of the gallbladder fossa. Mucosal enhancement of the gallbladder without gallbladder wall thickening or visualized cholelithiasis. Portal vein is patent. No intrahepatic or extrahepatic biliary ductal dilation. Pancreas: No pancreatic fat stranding. Coarse calcification of the pancreatic body likely reflecting sequela of remote prior pancreatitis. Spleen: Spleen is at the upper limits of normal in size. Adrenals/Urinary  Tract: Adrenal glands are unremarkable. No hydronephrosis. Kidneys enhance symmetrically. No obstructive nephrolithiasis. Bladder is decompressed. Stomach/Bowel: No evidence of bowel obstruction. Appendix is normal. Stomach is unremarkable. Small bowel is mildly edematous in appearance, likely reactive in the setting of ascites. Vascular/Lymphatic: Atherosclerotic calcifications of the aorta. No suspicious lymphadenopathy. Reproductive: Process present. Other: Small to moderate volume ascites. Musculoskeletal: No acute osseous abnormality. Degenerative changes most pronounced at L2-3. Limbus vertebra of L4. Review of the MIP images confirms the above findings. IMPRESSION: 1. No evidence of pulmonary embolism. 2. Profound hepatic steatosis with hypertrophy of the LEFT hepatic lobe likely reflecting a degree of underlying cirrhosis. There is small to moderate volume ascites. 3. Bronchial wall thickening with multifocal endobronchial debris, likely infectious or inflammatory in etiology. 4. Asymmetric RIGHT retroareolar nodular density likely reflects nodular gynecomastia given hepatic steatosis/likely underlying cirrhosis. However given nodular appearance, recommend correlation with physical exam and potential further evaluation with nonemergent outpatient diagnostic mammogram and ultrasound as deemed necessary. Aortic Atherosclerosis (ICD10-I70.0) and Emphysema (ICD10-J43.9). Electronically Signed   By: Meda Klinefelter M.D.   On: 10/09/2020 18:04   CT ABDOMEN PELVIS W CONTRAST  Result Date: 10/09/2020 CLINICAL DATA:  PE suspected, high prob; Abdominal distension EXAM: CT ANGIOGRAPHY CHEST CT ABDOMEN AND PELVIS WITH CONTRAST TECHNIQUE: Multidetector CT imaging of the chest was performed using the standard protocol during bolus administration of intravenous contrast. Multiplanar CT image reconstructions and MIPs were obtained to evaluate the vascular anatomy. Multidetector CT imaging of the abdomen and pelvis  was performed using the standard protocol during bolus administration of intravenous contrast. CONTRAST:  50mL OMNIPAQUE IOHEXOL 350 MG/ML SOLN COMPARISON:  None. FINDINGS: CTA CHEST FINDINGS Cardiovascular: Satisfactory opacification of the pulmonary arteries to the segmental level. No evidence of pulmonary embolism. Normal heart size. No pericardial effusion. LEFT-sided coronary artery atherosclerotic calcifications. Mediastinum/Nodes: No axillary or mediastinal adenopathy. Thyroid is unremarkable. Lungs/Pleura: Multifocal endobronchial and tracheal debris with mild bronchial wall thickening and plugging at the bases with mild RIGHT lower lobe atelectasis. Mild centrilobular emphysema. No pleural effusion or pneumothorax. Musculoskeletal: Asymmetric RIGHT breast retroareolar nodule measuring 14 mm. Review of the MIP images confirms the above findings. CT ABDOMEN and PELVIS FINDINGS Hepatobiliary: Profound hepatic steatosis. Hypertrophy of the LEFT liver. Focal fatty sparing of the gallbladder fossa. Mucosal enhancement of the gallbladder without gallbladder wall thickening or visualized cholelithiasis. Portal vein is patent. No intrahepatic or extrahepatic biliary ductal dilation. Pancreas: No pancreatic fat stranding. Coarse calcification of the pancreatic body likely reflecting sequela of remote prior pancreatitis. Spleen: Spleen is at the upper  limits of normal in size. Adrenals/Urinary Tract: Adrenal glands are unremarkable. No hydronephrosis. Kidneys enhance symmetrically. No obstructive nephrolithiasis. Bladder is decompressed. Stomach/Bowel: No evidence of bowel obstruction. Appendix is normal. Stomach is unremarkable. Small bowel is mildly edematous in appearance, likely reactive in the setting of ascites. Vascular/Lymphatic: Atherosclerotic calcifications of the aorta. No suspicious lymphadenopathy. Reproductive: Process present. Other: Small to moderate volume ascites. Musculoskeletal: No acute osseous  abnormality. Degenerative changes most pronounced at L2-3. Limbus vertebra of L4. Review of the MIP images confirms the above findings. IMPRESSION: 1. No evidence of pulmonary embolism. 2. Profound hepatic steatosis with hypertrophy of the LEFT hepatic lobe likely reflecting a degree of underlying cirrhosis. There is small to moderate volume ascites. 3. Bronchial wall thickening with multifocal endobronchial debris, likely infectious or inflammatory in etiology. 4. Asymmetric RIGHT retroareolar nodular density likely reflects nodular gynecomastia given hepatic steatosis/likely underlying cirrhosis. However given nodular appearance, recommend correlation with physical exam and potential further evaluation with nonemergent outpatient diagnostic mammogram and ultrasound as deemed necessary. Aortic Atherosclerosis (ICD10-I70.0) and Emphysema (ICD10-J43.9). Electronically Signed   By: Meda Klinefelter M.D.   On: 10/09/2020 18:04      Scheduled Meds:  folic acid  1 mg Oral Daily   magnesium oxide  400 mg Oral BID   thiamine  100 mg Oral Daily   Continuous Infusions:   LOS: 1 day      Time spent: 25 minutes   Noralee Stain, DO [facility] 10/10/2020, 12:53 PM   Available via Epic secure chat 7am-7pm After these hours, please refer to coverage provider listed on [email or web address]

## 2020-10-11 LAB — CBC
HCT: 31.6 % — ABNORMAL LOW (ref 39.0–52.0)
Hemoglobin: 11.1 g/dL — ABNORMAL LOW (ref 13.0–17.0)
MCH: 38.1 pg — ABNORMAL HIGH (ref 26.0–34.0)
MCHC: 35.1 g/dL (ref 30.0–36.0)
MCV: 108.6 fL — ABNORMAL HIGH (ref 80.0–100.0)
Platelets: 43 10*3/uL — ABNORMAL LOW (ref 150–400)
RBC: 2.91 MIL/uL — ABNORMAL LOW (ref 4.22–5.81)
RDW: 12.2 % (ref 11.5–15.5)
WBC: 2.6 10*3/uL — ABNORMAL LOW (ref 4.0–10.5)
nRBC: 0 % (ref 0.0–0.2)

## 2020-10-11 LAB — BASIC METABOLIC PANEL
Anion gap: 7 (ref 5–15)
BUN: 11 mg/dL (ref 8–23)
CO2: 36 mmol/L — ABNORMAL HIGH (ref 22–32)
Calcium: 7.9 mg/dL — ABNORMAL LOW (ref 8.9–10.3)
Chloride: 89 mmol/L — ABNORMAL LOW (ref 98–111)
Creatinine, Ser: 0.93 mg/dL (ref 0.61–1.24)
GFR, Estimated: >60 mL/min (ref >60)
Glucose, Bld: 78 mg/dL (ref 70–99)
Potassium: 3.9 mmol/L (ref 3.5–5.1)
Sodium: 132 mmol/L — ABNORMAL LOW (ref 135–145)

## 2020-10-11 LAB — HEPATIC FUNCTION PANEL
ALT: 44 U/L (ref 0–44)
AST: 90 U/L — ABNORMAL HIGH (ref 15–41)
Albumin: 2.1 g/dL — ABNORMAL LOW (ref 3.5–5.0)
Alkaline Phosphatase: 114 U/L (ref 38–126)
Bilirubin, Direct: 0.5 mg/dL — ABNORMAL HIGH (ref 0.0–0.2)
Indirect Bilirubin: 1 mg/dL — ABNORMAL HIGH (ref 0.3–0.9)
Total Bilirubin: 1.5 mg/dL — ABNORMAL HIGH (ref 0.3–1.2)
Total Protein: 5.2 g/dL — ABNORMAL LOW (ref 6.5–8.1)

## 2020-10-11 LAB — MAGNESIUM: Magnesium: 2 mg/dL (ref 1.7–2.4)

## 2020-10-11 MED ORDER — FOLIC ACID 1 MG PO TABS: 1.0000 mg | ORAL_TABLET | Freq: Every day | ORAL | 2 refills | Status: DC

## 2020-10-11 MED ORDER — MULTI-VITAMIN/MINERALS PO TABS: 1.0000 | ORAL_TABLET | Freq: Every day | ORAL | 2 refills | Status: AC

## 2020-10-11 MED ORDER — THIAMINE HCL 100 MG PO TABS: 100.0000 mg | ORAL_TABLET | Freq: Every day | ORAL | 2 refills | Status: DC

## 2020-10-11 NOTE — Discharge Summary (Addendum)
Physician Discharge Summary  [name] ZOX:[id number] DOB: 24-Jan-1955 DOA: 10/09/2020  PCP: System, Provider Not In  Admit date: 10/09/2020 Discharge date: 10/11/2020  Admitted From: Home Disposition:  Home  Recommendations for Outpatient Follow-up:  Establish with PCP as soon as possible Outpatient follow up with GI for alcoholic cirrhosis. Referral placed at time of discharge Encouraged continued alcohol cessation indefinitely Follow-up on vitamin B1/thiamine level which is pending at time of discharge Follow up with incidental finding of right retroareolar nodular density as outpatient  Discharge Condition: Stable CODE STATUS: Full code Diet recommendation: Heart healthy diet  Brief/Interim Summary: Paul Willis is a 65 y.o. male with medical history significant of alcohol abuse, asthma, coronary artery disease who presents to the ER with some tingling sensations in his hands legs as well as shortness of breath.  Patient has history of coronary artery disease with cardiac cath with stent.  Currently not on any medications.  Patient came to the ER was seen and evaluated and appears to be having liver cirrhosis.  Patient said he quit drinking 4 days ago.  He previously quit drinking then went back to drinking.  He has since been getting exertional dyspnea.  Usually when he climbs up stairs.  He noticed increasing leg abdominal swellings.  He has been also feeling weak in general.  Patient also has noted visual changes lately.  At this point patient being admitted with alcoholic cirrhosis but also worrisome for early Wernicke's disease.  He was additionally noted to have significant hypokalemia with potassium of 2.5.   Patient's symptoms continue to improve and resolved back to his baseline.  Potassium was replaced.  LFTs stabilized.  Patient was encouraged to stop all alcohol intake indefinitely and he was motivated for cessation.  Patient to set up with outpatient PCP as well as GI  referral.  Discharge Diagnoses:  Principal Problem:   Acute alcoholic liver disease Active Problems:   Hypokalemia   Asthma   CAD (coronary artery disease)   Alcoholism (HCC)   Alcoholic cirrhosis -CT abdomen pelvis revealed hepatic steatosis with hypertrophy of the left hepatic lobe reflecting underlying cirrhosis, small to moderate ascites -LFTs stabilized with supportive care -Patient will need to set up with outpatient GI   Alcohol abuse -TOC consulted for alcohol abuse counseling -Out of window for withdrawal    Chronic macrocytic anemia  -Folate deficiency - replace    Hypokalemia -Resolved  CAD -Stable   Paresthesias -CT head negative -Thiamine level pending -Paresthesias improved/resolved   Right retroareolar nodular density, 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 -Follow-up with outpatient work-up on nonemergent basis  Discharge Instructions  Discharge Instructions     Ambulatory referral to Gastroenterology   Complete by: As directed    What is the reason for referral?: Other Comment - alcoholic cirrhosis   Call MD for:  difficulty breathing, headache or visual disturbances   Complete by: As directed    Call MD for:  extreme fatigue   Complete by: As directed    Call MD for:  persistant dizziness or light-headedness   Complete by: As directed    Call MD for:  persistant nausea and vomiting   Complete by: As directed    Call MD for:  severe uncontrolled pain   Complete by: As directed    Call MD for:  temperature >100.4   Complete by: As directed    Diet - low sodium heart healthy   Complete by: As directed    Discharge instructions   Complete by: As  directed    You were cared for by a hospitalist during your hospital stay. If you have any questions about your discharge medications or the care you received while you were in the hospital after you are discharged, you can call the unit and ask to speak with the hospitalist on call if the hospitalist that took  care of you is not available. Once you are discharged, your primary care physician will handle any further medical issues. Please note that NO REFILLS for any discharge medications will be authorized once you are discharged, as it is imperative that you return to your primary care physician (or establish a relationship with a primary care physician if you do not have one) for your aftercare needs so that they can reassess your need for medications and monitor your lab values.   Increase activity slowly   Complete by: As directed       Allergies as of 10/11/2020   No Known Allergies      Medication List     TAKE these medications    folic acid 1 MG tablet Commonly known as: FOLVITE Take 1 tablet (1 mg total) by mouth daily. Start taking on: October 12, 2020   multivitamin with minerals tablet Take 1 tablet by mouth daily.   thiamine 100 MG tablet Take 1 tablet (100 mg total) by mouth daily. Start taking on: October 12, 2020        Follow-up Information     PCP Follow up.   Why: Establish with PCP as soon as possible for follow up               No Known Allergies  Consultations: None    Procedures/Studies: DG Chest 2 View  Result Date: 10/09/2020 CLINICAL DATA:  Shortness of breath. EXAM: CHEST - 2 VIEW COMPARISON:  None. FINDINGS: The heart size and mediastinal contours are within normal limits. Both lungs are clear. The visualized skeletal structures are unremarkable. IMPRESSION: No active cardiopulmonary disease. Electronically Signed   By: Lupita Raider M.D.   On: 10/09/2020 15:18   CT HEAD WO CONTRAST ( )  Result Date: 10/09/2020 CLINICAL DATA:  Acute neuro deficit. EXAM: CT HEAD WITHOUT CONTRAST TECHNIQUE: Contiguous axial images were obtained from the base of the skull through the vertex without intravenous contrast. COMPARISON:  None. FINDINGS: Brain: No evidence of acute infarction, hemorrhage, hydrocephalus, extra-axial collection or mass  lesion/mass effect. Vascular: No hyperdense vessel or unexpected calcification. Skull: Normal. Negative for fracture or focal lesion. Sinuses/Orbits: No acute finding. Other: None. IMPRESSION: No acute intracranial abnormality seen. Electronically Signed   By: Lupita Raider M.D.   On: 10/09/2020 17:46   CT Angio Chest PE W and/or Wo Contrast  Result Date: 10/09/2020 CLINICAL DATA:  PE suspected, high prob; Abdominal distension EXAM: CT ANGIOGRAPHY CHEST CT ABDOMEN AND PELVIS WITH CONTRAST TECHNIQUE: Multidetector CT imaging of the chest was performed using the standard protocol during bolus administration of intravenous contrast. Multiplanar CT image reconstructions and MIPs were obtained to evaluate the vascular anatomy. Multidetector CT imaging of the abdomen and pelvis was performed using the standard protocol during bolus administration of intravenous contrast. CONTRAST:  16mL OMNIPAQUE IOHEXOL 350 MG/ML SOLN COMPARISON:  None. FINDINGS: CTA CHEST FINDINGS Cardiovascular: Satisfactory opacification of the pulmonary arteries to the segmental level. No evidence of pulmonary embolism. Normal heart size. No pericardial effusion. LEFT-sided coronary artery atherosclerotic calcifications. Mediastinum/Nodes: No axillary or mediastinal adenopathy. Thyroid is unremarkable. Lungs/Pleura: Multifocal endobronchial and tracheal debris with  mild bronchial wall thickening and plugging at the bases with mild RIGHT lower lobe atelectasis. Mild centrilobular emphysema. No pleural effusion or pneumothorax. Musculoskeletal: Asymmetric RIGHT breast retroareolar nodule measuring 14 mm. Review of the MIP images confirms the above findings. CT ABDOMEN and PELVIS FINDINGS Hepatobiliary: Profound hepatic steatosis. Hypertrophy of the LEFT liver. Focal fatty sparing of the gallbladder fossa. Mucosal enhancement of the gallbladder without gallbladder wall thickening or visualized cholelithiasis. Portal vein is patent. No intrahepatic  or extrahepatic biliary ductal dilation. Pancreas: No pancreatic fat stranding. Coarse calcification of the pancreatic body likely reflecting sequela of remote prior pancreatitis. Spleen: Spleen is at the upper limits of normal in size. Adrenals/Urinary Tract: Adrenal glands are unremarkable. No hydronephrosis. Kidneys enhance symmetrically. No obstructive nephrolithiasis. Bladder is decompressed. Stomach/Bowel: No evidence of bowel obstruction. Appendix is normal. Stomach is unremarkable. Small bowel is mildly edematous in appearance, likely reactive in the setting of ascites. Vascular/Lymphatic: Atherosclerotic calcifications of the aorta. No suspicious lymphadenopathy. Reproductive: Process present. Other: Small to moderate volume ascites. Musculoskeletal: No acute osseous abnormality. Degenerative changes most pronounced at L2-3. Limbus vertebra of L4. Review of the MIP images confirms the above findings. IMPRESSION: 1. No evidence of pulmonary embolism. 2. Profound hepatic steatosis with hypertrophy of the LEFT hepatic lobe likely reflecting a degree of underlying cirrhosis. There is small to moderate volume ascites. 3. Bronchial wall thickening with multifocal endobronchial debris, likely infectious or inflammatory in etiology. 4. Asymmetric RIGHT retroareolar nodular density likely reflects nodular gynecomastia given hepatic steatosis/likely underlying cirrhosis. However given nodular appearance, recommend correlation with physical exam and potential further evaluation with nonemergent outpatient diagnostic mammogram and ultrasound as deemed necessary. Aortic Atherosclerosis (ICD10-I70.0) and Emphysema (ICD10-J43.9). Electronically Signed   By: Meda Klinefelter M.D.   On: 10/09/2020 18:04   CT ABDOMEN PELVIS W CONTRAST  Result Date: 10/09/2020 CLINICAL DATA:  PE suspected, high prob; Abdominal distension EXAM: CT ANGIOGRAPHY CHEST CT ABDOMEN AND PELVIS WITH CONTRAST TECHNIQUE: Multidetector CT imaging  of the chest was performed using the standard protocol during bolus administration of intravenous contrast. Multiplanar CT image reconstructions and MIPs were obtained to evaluate the vascular anatomy. Multidetector CT imaging of the abdomen and pelvis was performed using the standard protocol during bolus administration of intravenous contrast. CONTRAST:  27mL OMNIPAQUE IOHEXOL 350 MG/ML SOLN COMPARISON:  None. FINDINGS: CTA CHEST FINDINGS Cardiovascular: Satisfactory opacification of the pulmonary arteries to the segmental level. No evidence of pulmonary embolism. Normal heart size. No pericardial effusion. LEFT-sided coronary artery atherosclerotic calcifications. Mediastinum/Nodes: No axillary or mediastinal adenopathy. Thyroid is unremarkable. Lungs/Pleura: Multifocal endobronchial and tracheal debris with mild bronchial wall thickening and plugging at the bases with mild RIGHT lower lobe atelectasis. Mild centrilobular emphysema. No pleural effusion or pneumothorax. Musculoskeletal: Asymmetric RIGHT breast retroareolar nodule measuring 14 mm. Review of the MIP images confirms the above findings. CT ABDOMEN and PELVIS FINDINGS Hepatobiliary: Profound hepatic steatosis. Hypertrophy of the LEFT liver. Focal fatty sparing of the gallbladder fossa. Mucosal enhancement of the gallbladder without gallbladder wall thickening or visualized cholelithiasis. Portal vein is patent. No intrahepatic or extrahepatic biliary ductal dilation. Pancreas: No pancreatic fat stranding. Coarse calcification of the pancreatic body likely reflecting sequela of remote prior pancreatitis. Spleen: Spleen is at the upper limits of normal in size. Adrenals/Urinary Tract: Adrenal glands are unremarkable. No hydronephrosis. Kidneys enhance symmetrically. No obstructive nephrolithiasis. Bladder is decompressed. Stomach/Bowel: No evidence of bowel obstruction. Appendix is normal. Stomach is unremarkable. Small bowel is mildly edematous in  appearance, likely reactive in the  setting of ascites. Vascular/Lymphatic: Atherosclerotic calcifications of the aorta. No suspicious lymphadenopathy. Reproductive: Process present. Other: Small to moderate volume ascites. Musculoskeletal: No acute osseous abnormality. Degenerative changes most pronounced at L2-3. Limbus vertebra of L4. Review of the MIP images confirms the above findings. IMPRESSION: 1. No evidence of pulmonary embolism. 2. Profound hepatic steatosis with hypertrophy of the LEFT hepatic lobe likely reflecting a degree of underlying cirrhosis. There is small to moderate volume ascites. 3. Bronchial wall thickening with multifocal endobronchial debris, likely infectious or inflammatory in etiology. 4. Asymmetric RIGHT retroareolar nodular density likely reflects nodular gynecomastia given hepatic steatosis/likely underlying cirrhosis. However given nodular appearance, recommend correlation with physical exam and potential further evaluation with nonemergent outpatient diagnostic mammogram and ultrasound as deemed necessary. Aortic Atherosclerosis (ICD10-I70.0) and Emphysema (ICD10-J43.9). Electronically Signed   By: Meda Klinefelter M.D.   On: 10/09/2020 18:04      Discharge Exam: Vitals:   10/11/20 0452 10/11/20 0739  BP: 106/75 117/81  Pulse: 81 91  Resp: 20 18  Temp: 98.4 F (36.9 C) (!) 97.4 F (36.3 C)  SpO2: 93% 98%    General: Pt is alert, awake, not in acute distress Cardiovascular: RRR, S1/S2 +, no edema Respiratory: CTA bilaterally, no wheezing, no rhonchi, no respiratory distress, no conversational dyspnea  Abdominal: Soft, NT, ND, bowel sounds + Extremities: no edema, no cyanosis Psych: Normal mood and affect, stable judgement and insight     The results of significant diagnostics from this hospitalization (including imaging, microbiology, ancillary and laboratory) are listed below for reference.     Microbiology: Recent Results (from the past 240 hour(s))   Resp Panel by RT-PCR (Flu A&B, Covid) Nasopharyngeal Swab     Status: None   Collection Time: 10/09/20  3:31 PM   Specimen: Nasopharyngeal Swab; Nasopharyngeal(NP) swabs in vial transport medium  Result Value Ref Range Status   SARS Coronavirus 2 by RT PCR NEGATIVE NEGATIVE Final    Comment: (NOTE) SARS-CoV-2 target nucleic acids are NOT DETECTED.  The SARS-CoV-2 RNA is generally detectable in upper respiratory specimens during the acute phase of infection. The lowest concentration of SARS-CoV-2 viral copies this assay can detect is 138 copies/mL. A negative result does not preclude SARS-Cov-2 infection and should not be used as the sole basis for treatment or other patient management decisions. A negative result may occur with  improper specimen collection/handling, submission of specimen other than nasopharyngeal swab, presence of viral mutation(s) within the areas targeted by this assay, and inadequate number of viral copies(<138 copies/mL). A negative result must be combined with clinical observations, patient history, and epidemiological information. The expected result is Negative.  Fact Sheet for Patients:  BloggerCourse.com  Fact Sheet for Healthcare Providers:  SeriousBroker.it  This test is no t yet approved or cleared by the [place] FDA and  has been authorized for detection and/or diagnosis of SARS-CoV-2 by FDA under an Emergency Use Authorization (EUA). This EUA will remain  in effect (meaning this test can be used) for the duration of the COVID-19 declaration under Section 564(b)(1) of the Act, 21 U.S.C.section 360bbb-3(b)(1), unless the authorization is terminated  or revoked sooner.       Influenza A by PCR NEGATIVE NEGATIVE Final   Influenza B by PCR NEGATIVE NEGATIVE Final    Comment: (NOTE) The Xpert Xpress SARS-CoV-2/FLU/RSV plus assay is intended as an aid in the diagnosis of influenza from  Nasopharyngeal swab specimens and should not be used as a sole basis for treatment. Nasal washings  and aspirates are unacceptable for Xpert Xpress SARS-CoV-2/FLU/RSV testing.  Fact Sheet for Patients: BloggerCourse.com  Fact Sheet for Healthcare Providers: SeriousBroker.it  This test is not yet approved or cleared by the [place] FDA and has been authorized for detection and/or diagnosis of SARS-CoV-2 by FDA under an Emergency Use Authorization (EUA). This EUA will remain in effect (meaning this test can be used) for the duration of the COVID-19 declaration under Section 564(b)(1) of the Act, 21 U.S.C. section 360bbb-3(b)(1), unless the authorization is terminated or revoked.  Performed at [facility], [address] [place], [place] [address]      Labs: BNP (last 3 results) Recent Labs    10/09/20 1531  BNP 143.1*   Basic Metabolic Panel: Recent Labs  Lab 10/09/20 1448 10/10/20 0514 10/11/20 0427  NA 129* 130* 132*  K 2.5* 2.9* 3.9  CL 80* 84* 89*  CO2 34* 39* 36*  GLUCOSE 126* 89 78  BUN 18 13 11   CREATININE 1.11 0.88 0.93  CALCIUM 8.3* 7.8* 7.9*  MG 1.7  --  2.0   Liver Function Tests: Recent Labs  Lab 10/09/20 1448 10/10/20 0514 10/11/20 0427  AST 96* 77* 90*  ALT 49* 41 44  ALKPHOS 141* 126 114  BILITOT 2.3* 2.2* 1.5*  PROT 5.5* 4.9* 5.2*  ALBUMIN 2.4* 2.2* 2.1*   Recent Labs  Lab 10/09/20 1448  LIPASE 56*   No results for input(s): AMMONIA in the last 168 hours. CBC: Recent Labs  Lab 10/09/20 1448 10/10/20 0514 10/11/20 0427  WBC 5.5 2.3* 2.6*  HGB 12.0* 10.8* 11.1*  HCT 32.4* 29.6* 31.6*  MCV 107.6* 107.6* 108.6*  PLT 70* 43* 43*   Cardiac Enzymes: No results for input(s): CKTOTAL, CKMB, CKMBINDEX, TROPONINI in the last 168 hours. BNP: Invalid input(s): POCBNP CBG: No results for input(s): GLUCAP in the last 168 hours. D-Dimer No results for input(s): DDIMER in  the last 72 hours. Hgb A1c No results for input(s): HGBA1C in the last 72 hours. Lipid Profile No results for input(s): CHOL, HDL, LDLCALC, TRIG, CHOLHDL, LDLDIRECT in the last 72 hours. Thyroid function studies No results for input(s): TSH, T4TOTAL, T3FREE, THYROIDAB in the last 72 hours.  Invalid input(s): FREET3 Anemia work up Recent Labs    10/09/20 1531  VITAMINB12 604  FOLATE 5.3*   Urinalysis No results found for: COLORURINE, APPEARANCEUR, LABSPEC, PHURINE, GLUCOSEU, HGBUR, BILIRUBINUR, KETONESUR, PROTEINUR, UROBILINOGEN, NITRITE, LEUKOCYTESUR Sepsis Labs Invalid input(s): PROCALCITONIN,  WBC,  LACTICIDVEN Microbiology Recent Results (from the past 240 hour(s))  Resp Panel by RT-PCR (Flu A&B, Covid) Nasopharyngeal Swab     Status: None   Collection Time: 10/09/20  3:31 PM   Specimen: Nasopharyngeal Swab; Nasopharyngeal(NP) swabs in vial transport medium  Result Value Ref Range Status   SARS Coronavirus 2 by RT PCR NEGATIVE NEGATIVE Final    Comment: (NOTE) SARS-CoV-2 target nucleic acids are NOT DETECTED.  The SARS-CoV-2 RNA is generally detectable in upper respiratory specimens during the acute phase of infection. The lowest concentration of SARS-CoV-2 viral copies this assay can detect is 138 copies/mL. A negative result does not preclude SARS-Cov-2 infection and should not be used as the sole basis for treatment or other patient management decisions. A negative result may occur with  improper specimen collection/handling, submission of specimen other than nasopharyngeal swab, presence of viral mutation(s) within the areas targeted by this assay, and inadequate number of viral copies(<138 copies/mL). A negative result must be combined with clinical observations, patient history, and  epidemiological information. The expected result is Negative.  Fact Sheet for Patients:  BloggerCourse.com  Fact Sheet for Healthcare Providers:   SeriousBroker.it  This test is no t yet approved or cleared by the [place] FDA and  has been authorized for detection and/or diagnosis of SARS-CoV-2 by FDA under an Emergency Use Authorization (EUA). This EUA will remain  in effect (meaning this test can be used) for the duration of the COVID-19 declaration under Section 564(b)(1) of the Act, 21 U.S.C.section 360bbb-3(b)(1), unless the authorization is terminated  or revoked sooner.       Influenza A by PCR NEGATIVE NEGATIVE Final   Influenza B by PCR NEGATIVE NEGATIVE Final    Comment: (NOTE) The Xpert Xpress SARS-CoV-2/FLU/RSV plus assay is intended as an aid in the diagnosis of influenza from Nasopharyngeal swab specimens and should not be used as a sole basis for treatment. Nasal washings and aspirates are unacceptable for Xpert Xpress SARS-CoV-2/FLU/RSV testing.  Fact Sheet for Patients: BloggerCourse.com  Fact Sheet for Healthcare Providers: SeriousBroker.it  This test is not yet approved or cleared by the [place] FDA and has been authorized for detection and/or diagnosis of SARS-CoV-2 by FDA under an Emergency Use Authorization (EUA). This EUA will remain in effect (meaning this test can be used) for the duration of the COVID-19 declaration under Section 564(b)(1) of the Act, 21 U.S.C. section 360bbb-3(b)(1), unless the authorization is terminated or revoked.  Performed at [facility], [address] [place], [place] [address]      Patient was seen and examined on the day of discharge and was found to be in stable condition. Time coordinating discharge: 25 minutes including assessment and coordination of care, as well as examination of the patient.   SIGNED:  Noralee Stain, DO [facility] 10/11/2020, 9:54 AM

## 2020-10-13 LAB — VITAMIN B1: Vitamin B1 (Thiamine): 84.7 nmol/L (ref 66.5–200.0)

## 2020-10-16 ENCOUNTER — Emergency Department: Payer: BC Managed Care – PPO

## 2020-10-16 ENCOUNTER — Emergency Department
Admission: EM | Admit: 2020-10-16 | Discharge: 2020-10-16 | Disposition: A | Discharge status: Home / Self Care | Payer: BC Managed Care – PPO | Attending: Emergency Medicine | Admitting: Emergency Medicine

## 2020-10-16 ENCOUNTER — Other Ambulatory Visit: Payer: Self-pay

## 2020-10-16 DIAGNOSIS — J45909 Unspecified asthma, uncomplicated: Secondary | ICD-10-CM | POA: Insufficient documentation

## 2020-10-16 DIAGNOSIS — I2510 Atherosclerotic heart disease of native coronary artery without angina pectoris: Secondary | ICD-10-CM | POA: Diagnosis not present

## 2020-10-16 DIAGNOSIS — K5900 Constipation, unspecified: Secondary | ICD-10-CM | POA: Insufficient documentation

## 2020-10-16 DIAGNOSIS — R140 Abdominal distension (gaseous): Secondary | ICD-10-CM | POA: Diagnosis not present

## 2020-10-16 MED ORDER — POLYETHYLENE GLYCOL 3350 17 G PO PACK: 17.0000 g | PACK | Freq: Every day | ORAL | 0 refills | Status: DC

## 2020-10-16 MED ORDER — MAGNESIUM CITRATE PO SOLN: 0.5000 | Freq: Two times a day (BID) | ORAL | 0 refills | Status: DC

## 2020-10-16 NOTE — ED Provider Notes (Signed)
[facility] Emergency Department Provider Note ____________________________________________   Event Date/Time   First MD Initiated Contact with Patient 10/16/20 1327     (approximate)  I have reviewed the triage vital signs and the nursing notes.   HISTORY  Chief Complaint Constipation    HPI Paul Willis is a 65 y.o. male with PMH as noted below including asthma, CAD, alcoholic liver cirrhosis, and prior alcohol abuse who presents with constipation over the last 5 days, persistent course, and associated with a mild sensation of bloating in his abdomen but no abdominal pain.  He denies any associated nausea or vomiting, or any pain or pressure in his rectum.  He denies any significant prior history of constipation.  He was just admitted last week for new onset liver cirrhosis and hypokalemia.  He last had a bowel movement the day of his discharge.  He states that he has not had any alcohol since before coming into the hospital.  He has not taken anything at home for the constipation.  He denies any other acute symptoms.   Past Medical History:  Diagnosis Date   Acute MI [facility])    Asthma     Patient Active Problem List   Diagnosis Date Noted   Hypokalemia 10/09/2020   Asthma 10/09/2020   CAD (coronary artery disease) 10/09/2020   Alcoholism (HCC) 10/09/2020   Acute alcoholic liver disease 10/09/2020    Past Surgical History:  Procedure Laterality Date   CARDIAC SURGERY      Prior to Admission medications   Medication Sig Start Date End Date Taking? Authorizing Provider  magnesium citrate SOLN Take 148 mLs (0.5 Bottles total) by mouth 2 (two) times daily. 10/16/20  Yes Dionne Bucy, MD  polyethylene glycol (MIRALAX) 17 g packet Take 17 g by mouth daily. 10/16/20  Yes Dionne Bucy, MD  folic acid (FOLVITE) 1 MG tablet Take 1 tablet (1 mg total) by mouth daily. 10/12/20   Noralee Stain, DO  Multiple Vitamins-Minerals (MULTIVITAMIN WITH  MINERALS) tablet Take 1 tablet by mouth daily. 10/11/20 01/09/21  Noralee Stain, DO  thiamine 100 MG tablet Take 1 tablet (100 mg total) by mouth daily. 10/12/20   Noralee Stain, DO    Allergies Patient has no known allergies.  No family history on file.  Social History Social History   Tobacco Use   Smoking status: Never   Smokeless tobacco: Never    Review of Systems  Constitutional: No fever/chills Eyes: No visual changes. ENT: No sore throat. Cardiovascular: Denies chest pain. Respiratory: Denies shortness of breath. Gastrointestinal: No abdominal pain, vomiting or diarrhea.  Positive for constipation. Genitourinary: Negative for dysuria.  Musculoskeletal: Negative for back pain. Skin: Negative for rash. Neurological: Negative for headaches, focal weakness or numbness.   ____________________________________________   PHYSICAL EXAM:  VITAL SIGNS: ED Triage Vitals  Enc Vitals Group     BP 10/16/20 1138 (!) 144/75     Pulse Rate 10/16/20 1138 90     Resp 10/16/20 1138 18     Temp 10/16/20 1138 98.2 F (36.8 C)     Temp src --      SpO2 10/16/20 1138 98 %     Weight 10/16/20 1136 175 lb (79.4 kg)     Height 10/16/20 1136 5\' 9"  (1.753 m)     Head Circumference --      Peak Flow --      Pain Score 10/16/20 1136 0     Pain Loc --  Pain Edu? --      Excl. in GC? --     Constitutional: Alert and oriented. Well appearing and in no acute distress. Eyes: Conjunctivae are normal.  No scleral icterus. Head: Atraumatic. Nose: No congestion/rhinnorhea. Mouth/Throat: Mucous membranes are moist.   Neck: Normal range of motion.  Cardiovascular: Normal rate, regular rhythm. Good peripheral circulation. Respiratory: Normal respiratory effort.  No retractions. Gastrointestinal: Soft and nontender. No distention.  Genitourinary: No flank tenderness. Musculoskeletal: Extremities warm and well perfused.  Neurologic:  Normal speech and language. No gross focal  neurologic deficits are appreciated.  Skin:  Skin is warm and dry. No rash noted. Psychiatric: Mood and affect are normal. Speech and behavior are normal.  ____________________________________________   LABS (all labs ordered are listed, but only abnormal results are displayed)  Labs Reviewed - No data to display ____________________________________________  EKG   ____________________________________________  RADIOLOGY  XR abdomen: Nonobstructive bowel gas pattern  ____________________________________________   PROCEDURES  Procedure(s) performed: No  Procedures  Critical Care performed: No ____________________________________________   INITIAL IMPRESSION / ASSESSMENT AND PLAN / ED COURSE  Pertinent labs & imaging results that were available during my care of the patient were reviewed by me and considered in my medical decision making (see chart for details).   65 year old male with PMH as noted above including asthma, CAD, liver cirrhosis, and former alcohol abuse presents with constipation over the last 5 days with no abdominal pain or vomiting.  Review of systems is otherwise negative.  I reviewed the past medical records in Epic and confirmed the recent prior admission, with discharge on 9/27.  On exam, the patient is well-appearing.  His vital signs are normal.  The abdomen is soft and nontender.  X-ray of the abdomen was obtained from triage and shows a nonobstructive bowel gas pattern.  Overall presentation is consistent with simple constipation.  I suspect that this could be exacerbated by the patient having started new medications, been deconditioned in the hospital over several days, and discontinuing alcohol.  There is no clinical evidence for bowel obstruction or other surgical etiology.  Given the otherwise negative review of systems, there is no indication for further ED work-up.  The patient has not tried anything at home, so we will start with MiraLAX.   I have also prescribed magnesium citrate in case the MiraLAX does not work.  I gave the patient thorough return precautions and he expressed understanding.  He is stable for discharge at this time.   ____________________________________________   FINAL CLINICAL IMPRESSION(S) / ED DIAGNOSES  Final diagnoses:  Constipation, unspecified constipation type      NEW MEDICATIONS STARTED DURING THIS VISIT:  Discharge Medication List as of 10/16/2020  2:02 PM     START taking these medications   Details  magnesium citrate SOLN Take 148 mLs (0.5 Bottles total) by mouth 2 (two) times daily., Starting Sun 10/16/2020, Normal    polyethylene glycol (MIRALAX) 17 g packet Take 17 g by mouth daily., Starting Sun 10/16/2020, Normal         Note:  This document was prepared using Dragon voice recognition software and may include unintentional dictation errors.    Dionne Bucy, MD 10/16/20 1444

## 2020-10-16 NOTE — Discharge Instructions (Addendum)
Start taking the MiraLAX as prescribed over the next few days.  If you continue to not have a bowel movement, you can take the magnesium citrate.  Take half a bottle, and if you do not have a bowel movement within several hours you can take the other half of the bottle.  Return to the ER immediately for new, worsening, or persistent constipation, and the abdominal pain or worsened bloating, nausea or vomiting, fever, blood in the stool, or any other new or worsening symptoms that concern you.

## 2020-10-16 NOTE — ED Notes (Signed)
Patient given discharge instructions, all questions answered. Patient in possession of all belongings, directed to the discharge area  

## 2020-10-16 NOTE — ED Provider Notes (Signed)
Emergency Medicine Provider Triage Evaluation Note  Paul Willis , a 65 y.o. male  was evaluated in triage.  Pt complains of abdominal bloating and constipation.  Reports he has not had a BM since Tuesday morning.  Denies nausea, vomiting, abdominal pain or blood in his stool.  Review of Systems  Positive: Abdominal bloating and constipation Negative: Nausea, vomiting, abdominal pain or blood in the stool  Physical Exam  BP (!) 144/75   Pulse 90   Temp 98.2 F (36.8 C)   Resp 18   Ht 5\' 9"  (1.753 m)   Wt 79.4 kg   SpO2 98%   BMI 25.84 kg/m  Gen:   Awake, no distress   Resp:  Normal effort  Abd:  Distended and tight.  Nontender.  Medical Decision Making  Medically screening exam initiated at 11:42 AM.  Appropriate orders placed.  was informed that the remainder of the evaluation will be completed by another provider, this initial triage assessment does not replace that evaluation, and the importance of remaining in the ED until their evaluation is complete.     Standley Brooking, NP 10/16/20 1147    12/16/20, MD 10/16/20 1524

## 2020-10-16 NOTE — ED Triage Notes (Signed)
Pt reports not able to make a bowel movement since Tuesday. Pt reports feels bloated but not really pain.

## 2020-10-23 ENCOUNTER — Other Ambulatory Visit: Payer: Self-pay

## 2020-10-23 ENCOUNTER — Emergency Department: Payer: BC Managed Care – PPO

## 2020-10-23 ENCOUNTER — Emergency Department
Admission: EM | Admit: 2020-10-23 | Discharge: 2020-10-23 | Disposition: A | Discharge status: Home / Self Care | Payer: BC Managed Care – PPO | Attending: Emergency Medicine | Admitting: Emergency Medicine

## 2020-10-23 DIAGNOSIS — I2510 Atherosclerotic heart disease of native coronary artery without angina pectoris: Secondary | ICD-10-CM | POA: Insufficient documentation

## 2020-10-23 DIAGNOSIS — R600 Localized edema: Secondary | ICD-10-CM | POA: Insufficient documentation

## 2020-10-23 DIAGNOSIS — I739 Peripheral vascular disease, unspecified: Secondary | ICD-10-CM | POA: Diagnosis not present

## 2020-10-23 DIAGNOSIS — J45909 Unspecified asthma, uncomplicated: Secondary | ICD-10-CM | POA: Diagnosis not present

## 2020-10-23 DIAGNOSIS — M7989 Other specified soft tissue disorders: Secondary | ICD-10-CM | POA: Diagnosis not present

## 2020-10-23 DIAGNOSIS — Z872 Personal history of diseases of the skin and subcutaneous tissue: Secondary | ICD-10-CM | POA: Diagnosis not present

## 2020-10-23 LAB — CBC
HCT: 34.6 % — ABNORMAL LOW (ref 39.0–52.0)
Hemoglobin: 11.9 g/dL — ABNORMAL LOW (ref 13.0–17.0)
MCH: 37.1 pg — ABNORMAL HIGH (ref 26.0–34.0)
MCHC: 34.4 g/dL (ref 30.0–36.0)
MCV: 107.8 fL — ABNORMAL HIGH (ref 80.0–100.0)
Platelets: 208 10*3/uL (ref 150–400)
RBC: 3.21 MIL/uL — ABNORMAL LOW (ref 4.22–5.81)
RDW: 13.8 % (ref 11.5–15.5)
WBC: 6.6 10*3/uL (ref 4.0–10.5)
nRBC: 0 % (ref 0.0–0.2)

## 2020-10-23 LAB — HEPATIC FUNCTION PANEL
ALT: 32 U/L (ref 0–44)
AST: 48 U/L — ABNORMAL HIGH (ref 15–41)
Albumin: 2.5 g/dL — ABNORMAL LOW (ref 3.5–5.0)
Alkaline Phosphatase: 106 U/L (ref 38–126)
Bilirubin, Direct: 0.2 mg/dL (ref 0.0–0.2)
Indirect Bilirubin: 0.7 mg/dL (ref 0.3–0.9)
Total Bilirubin: 0.9 mg/dL (ref 0.3–1.2)
Total Protein: 5.7 g/dL — ABNORMAL LOW (ref 6.5–8.1)

## 2020-10-23 LAB — BASIC METABOLIC PANEL
Anion gap: 5 (ref 5–15)
BUN: 6 mg/dL — ABNORMAL LOW (ref 8–23)
CO2: 30 mmol/L (ref 22–32)
Calcium: 8.3 mg/dL — ABNORMAL LOW (ref 8.9–10.3)
Chloride: 101 mmol/L (ref 98–111)
Creatinine, Ser: 0.8 mg/dL (ref 0.61–1.24)
GFR, Estimated: >60 mL/min (ref >60)
Glucose, Bld: 87 mg/dL (ref 70–99)
Potassium: 3.7 mmol/L (ref 3.5–5.1)
Sodium: 136 mmol/L (ref 135–145)

## 2020-10-23 LAB — BRAIN NATRIURETIC PEPTIDE: B Natriuretic Peptide: 81.6 pg/mL (ref 0.0–100.0)

## 2020-10-23 MED ORDER — FUROSEMIDE 40 MG PO TABS: 40.0000 mg | ORAL_TABLET | Freq: Every day | ORAL | 0 refills | Status: DC

## 2020-10-23 MED ORDER — FUROSEMIDE 40 MG PO TABS
40.0000 mg | ORAL_TABLET | Freq: Once | ORAL | Status: AC
  Administered 2020-10-23: 40 mg via ORAL
  Filled 2020-10-23: qty 1

## 2020-10-23 NOTE — ED Provider Notes (Signed)
[facility] Emergency Department Provider Note  ____________________________________________  Time seen: Approximately 2:30 PM  I have reviewed the triage vital signs and the nursing notes.   HISTORY  Chief Complaint Leg Swelling    HPI Paul Willis is a 65 y.o. male who presents the emergency department complaining of bilateral lower extremity edema, pain and erythema.  Patient states that he was seen several weeks ago when symptoms were more mild.  Patient states that the edema, pain in both of his legs have been increasing.  He is having some erythema of the left foot.  No wounds.  No recent surgeries.  No long trips.  Patient does not use any kind of hormones.  Patient has a recent diagnosis for alcoholic cirrhosis and has had some edema issues specifically in his abdomen without evidence of acute ascites.  Patient denies any fevers or chills.  No back pain.  Sensation still intact to both feet that his feet feel tight and somewhat numb.       Past Medical History:  Diagnosis Date   Acute MI [facility])    Asthma     Patient Active Problem List   Diagnosis Date Noted   Hypokalemia 10/09/2020   Asthma 10/09/2020   CAD (coronary artery disease) 10/09/2020   Alcoholism (HCC) 10/09/2020   Acute alcoholic liver disease 10/09/2020    Past Surgical History:  Procedure Laterality Date   CARDIAC SURGERY      Prior to Admission medications   Medication Sig Start Date End Date Taking? Authorizing Provider  furosemide (LASIX) 40 MG tablet Take 1 tablet (40 mg total) by mouth daily for 5 days. 10/23/20 10/28/20 Yes Litzi Binning, Delorise Royals, PA-C  folic acid (FOLVITE) 1 MG tablet Take 1 tablet (1 mg total) by mouth daily. 10/12/20   Noralee Stain, DO  magnesium citrate SOLN Take 148 mLs (0.5 Bottles total) by mouth 2 (two) times daily. 10/16/20   Dionne Bucy, MD  Multiple Vitamins-Minerals (MULTIVITAMIN WITH MINERALS) tablet Take 1 tablet by mouth daily. 10/11/20  01/09/21  Noralee Stain, DO  polyethylene glycol (MIRALAX) 17 g packet Take 17 g by mouth daily. 10/16/20   Dionne Bucy, MD  thiamine 100 MG tablet Take 1 tablet (100 mg total) by mouth daily. 10/12/20   Noralee Stain, DO    Allergies Patient has no known allergies.  No family history on file.  Social History Social History   Tobacco Use   Smoking status: Never   Smokeless tobacco: Never     Review of Systems  Constitutional: No fever/chills Eyes: No visual changes. No discharge ENT: No upper respiratory complaints. Cardiovascular: no chest pain. Respiratory: no cough. No SOB. Gastrointestinal: No abdominal pain.  No nausea, no vomiting.  No diarrhea.  No constipation. Genitourinary: Negative for dysuria. No hematuria Musculoskeletal: Negative for musculoskeletal pain. Skin: Negative for rash, abrasions, lacerations, ecchymosis. Neurological: Negative for headaches, focal weakness or numbness.  10 System ROS otherwise negative.  ____________________________________________   PHYSICAL EXAM:  VITAL SIGNS: ED Triage Vitals  Enc Vitals Group     BP 10/23/20 1354 (!) 147/81     Pulse Rate 10/23/20 1354 (!) 101     Resp 10/23/20 1354 18     Temp 10/23/20 1354 98.3 F (36.8 C)     Temp Source 10/23/20 1354 Oral     SpO2 10/23/20 1354 99 %     Weight 10/23/20 1354 170 lb (77.1 kg)     Height 10/23/20 1354 5\' 9"  (1.753 m)  Head Circumference --      Peak Flow --      Pain Score 10/23/20 1401 6     Pain Loc --      Pain Edu? --      Excl. in GC? --      Constitutional: Alert and oriented. Well appearing and in no acute distress. Eyes: Conjunctivae are normal. PERRL. EOMI. Head: Atraumatic. ENT:      Ears:       Nose: No congestion/rhinnorhea.      Mouth/Throat: Mucous membranes are moist.  Neck: No stridor.    Cardiovascular: Normal rate, regular rhythm. Normal S1 and S2.  Good peripheral circulation. Respiratory: Normal respiratory effort without  tachypnea or retractions. Lungs CTAB. Good air entry to the bases with no decreased or absent breath sounds. Gastrointestinal: Bowel sounds 4 quadrants. Soft and nontender to palpation. No guarding or rigidity. No palpable masses. No distention. No CVA tenderness. Musculoskeletal: Full range of motion to all extremities. No gross deformities appreciated.  Visualization of bilateral lower extremities reveals edema bilaterally.  There is some erythema of the left foot but no open wounds and this does not appear cellulitic.  Findings appear to be venous stasis changes to the left foot.  There is no ulcerations.  No open wounds.  Bilateral lower extremities does have 2+ pitting edema.  Mild diffuse tenderness throughout the lower extremities.  Pulses intact to both feet with sensation intact all digits of both feet. Neurologic:  Normal speech and language. No gross focal neurologic deficits are appreciated.  Skin:  Skin is warm, dry and intact. No rash noted. Psychiatric: Mood and affect are normal. Speech and behavior are normal. Patient exhibits appropriate insight and judgement.   ____________________________________________   LABS (all labs ordered are listed, but only abnormal results are displayed)  Labs Reviewed  BASIC METABOLIC PANEL - Abnormal; Notable for the following components:      Result Value   BUN 6 (*)    Calcium 8.3 (*)    All other components within normal limits  CBC - Abnormal; Notable for the following components:   RBC 3.21 (*)    Hemoglobin 11.9 (*)    HCT 34.6 (*)    MCV 107.8 (*)    MCH 37.1 (*)    All other components within normal limits  HEPATIC FUNCTION PANEL - Abnormal; Notable for the following components:   Total Protein 5.7 (*)    Albumin 2.5 (*)    AST 48 (*)    All other components within normal limits  BRAIN NATRIURETIC PEPTIDE   ____________________________________________  EKG   ____________________________________________  RADIOLOGY I  personally viewed and evaluated these images as part of my medical decision making, as well as reviewing the written report by the radiologist.  ED Provider Interpretation: Ultrasound with no evidence of DVT  US Venous Img Lower Bilateral  Result Date: 10/23/2020 CLINICAL DATA:  Bilateral lower extremity edema, erythema EXAM: BILATERAL LOWER EXTREMITY VENOUS DOPPLER ULTRASOUND TECHNIQUE: Gray-scale sonography with compression, as well as color and duplex ultrasound, were performed to evaluate the deep venous system(s) from the level of the common femoral vein through the popliteal and proximal calf veins. COMPARISON:  None. FINDINGS: VENOUS Normal compressibility of the common femoral, superficial femoral, and popliteal veins, as well as the visualized calf veins. Visualized portions of profunda femoral vein and great saphenous vein unremarkable. No filling defects to suggest DVT on grayscale or color Doppler imaging. Doppler waveforms show normal direction of venous  flow, normal respiratory plasticity and response to augmentation. Limited views of the contralateral common femoral vein are unremarkable. OTHER None. Limitations: none IMPRESSION: Negative. Electronically Signed   By: Charlett Nose M.D.   On: 10/23/2020 15:10    ____________________________________________    PROCEDURES  Procedure(s) performed:    Procedures    Medications  furosemide (LASIX) tablet 40 mg (has no administration in time range)     ____________________________________________   INITIAL IMPRESSION / ASSESSMENT AND PLAN / ED COURSE  Pertinent labs & imaging results that were available during my care of the patient were reviewed by me and considered in my medical decision making (see chart for details).  Review of the [place] CSRS was performed in accordance of the [employer] prior to dispensing any controlled drugs.           Patient's diagnosis is consistent with peripheral vascular disease.  Patient presents  the emergency department with increased edema of his lower extremities.  Recent diagnosis of cirrhosis of the liver.  No recent trauma.  Patient has been seen at urgent care and referred to the ED for further evaluation.  Labs are reassuring or improved from previous encounter, patient lower extremities were slightly edematous bilaterally.  Did have some erythema of the left foot which appears to be venous stasis dermatitis.  At this time there was no evidence of DVT.  Patient still has sensation and pulses to bilateral lower extremities.  There is no evidence of arterial occlusion.  I will refer the patient to vascular surgery for further management of likely peripheral vascular disease.  Return cautions discussed with the patient at this time. Patient is given ED precautions to return to the ED for any worsening or new symptoms.     ____________________________________________  FINAL CLINICAL IMPRESSION(S) / ED DIAGNOSES  Final diagnoses:  Bilateral lower extremity edema  PVD (peripheral vascular disease) (HCC)      NEW MEDICATIONS STARTED DURING THIS VISIT:  ED Discharge Orders          Ordered    furosemide (LASIX) 40 MG tablet  Daily        10/23/20 1655                This chart was dictated using voice recognition software/Dragon. Despite best efforts to proofread, errors can occur which can change the meaning. Any change was purely unintentional.    Racheal Patches, PA-C 10/23/20 1656    Gilles Chiquito, MD 10/23/20 1901

## 2020-10-23 NOTE — ED Triage Notes (Addendum)
Pt to ER from [facility] with complaints of bilateral foot swelling and pain. States the provider at [facility] told him his left foot was "concerning".  Reports standing and walking a lot for his job x4 days a week, states he wears work boots. Reports pain is "throbbing", has been ongoing for 4 weeks. Difficulty with standing.   2+ pitting edema bilaterally. Left foot with redness present to great toe. Denies recent travel/ [facility]/ [place].

## 2020-11-14 ENCOUNTER — Ambulatory Visit (INDEPENDENT_AMBULATORY_CARE_PROVIDER_SITE_OTHER): Payer: BC Managed Care – PPO | Admitting: Nurse Practitioner

## 2020-11-14 ENCOUNTER — Encounter (INDEPENDENT_AMBULATORY_CARE_PROVIDER_SITE_OTHER): Payer: Self-pay | Admitting: Nurse Practitioner

## 2020-11-14 ENCOUNTER — Other Ambulatory Visit: Payer: Self-pay

## 2020-11-14 VITALS — BP 148/70 | HR 103 | Ht 69.0 in | Wt 151.0 lb

## 2020-11-14 DIAGNOSIS — R202 Paresthesia of skin: Secondary | ICD-10-CM | POA: Diagnosis not present

## 2020-11-14 DIAGNOSIS — M7989 Other specified soft tissue disorders: Secondary | ICD-10-CM | POA: Diagnosis not present

## 2020-11-14 DIAGNOSIS — R200 Anesthesia of skin: Secondary | ICD-10-CM | POA: Diagnosis not present

## 2020-11-14 DIAGNOSIS — K7010 Alcoholic hepatitis without ascites: Secondary | ICD-10-CM

## 2020-11-14 NOTE — Progress Notes (Signed)
Subjective:    Patient ID: Paul Willis, male    DOB: 1955/11/28, 65 y.o.   MRN: [id number] Chief Complaint  Patient presents with   New Patient (Initial Visit)    NP consult from the ER DVT    Paul Willis is a 65 year old male that presents today following a recent emergency room visit.  The patient notes that he has severe swelling of both lower extremities with the left being worse that extended up to his thigh.  The patient has a recent diagnosis of cirrhosis of the liver.  It was noted that he had swelling in his abdomen although with no evidence of acute ascites.  The patient was given several days worth of Lasix and the patient notes that this completely resolved all swelling issues that he had.  However he continues to endorse having numbness and discomfort in his lower extremities.  He denies any fevers or chills.  There are no claudication-like symptoms.   Review of Systems  Cardiovascular:  Positive for leg swelling.  Neurological:  Positive for numbness.  All other systems reviewed and are negative.     Objective:   Physical Exam Vitals reviewed.  HENT:     Head: Normocephalic.  Cardiovascular:     Rate and Rhythm: Normal rate.     Pulses:          Dorsalis pedis pulses are 2+ on the right side and 2+ on the left side.       Posterior tibial pulses are 2+ on the right side and 2+ on the left side.  Pulmonary:     Effort: Pulmonary effort is normal.  Musculoskeletal:     Right lower leg: No edema.     Left lower leg: No edema.  Skin:    Capillary Refill: Capillary refill takes 2 to 3 seconds.  Neurological:     Mental Status: He is alert and oriented to person, place, and time.  Psychiatric:        Mood and Affect: Mood normal.        Behavior: Behavior normal.        Thought Content: Thought content normal.        Judgment: Judgment normal.    BP (!) 148/70   Pulse (!) 103   Ht 5\' 9"  (1.753 m)   Wt 151 lb (68.5 kg)   BMI 22.30 kg/m   Past Medical  History:  Diagnosis Date   Acute MI (HCC)    Asthma     Social History   Socioeconomic History   Marital status: Single    Spouse name: Not on file   Number of children: Not on file   Years of education: Not on file   Highest education level: Not on file  Occupational History   Not on file  Tobacco Use   Smoking status: Never   Smokeless tobacco: Never  Substance and Sexual Activity   Alcohol use: Not on file   Drug use: Not on file   Sexual activity: Not on file  Other Topics Concern   Not on file  Social History Narrative   Not on file   Social Determinants of Health   Financial Resource Strain: Not on file  Food Insecurity: Not on file  Transportation Needs: Not on file  Physical Activity: Not on file  Stress: Not on file  Social Connections: Not on file  Intimate Partner Violence: Not on file    Past Surgical History:  Procedure Laterality Date  CARDIAC SURGERY      History reviewed. No pertinent family history.  No Known Allergies  CBC Latest Ref Rng & Units 10/23/2020 10/11/2020 10/10/2020  WBC 4.0 - 10.5 K/uL 6.6 2.6(L) 2.3(L)  Hemoglobin 13.0 - 17.0 g/dL 11.9(L) 11.1(L) 10.8(L)  Hematocrit 39.0 - 52.0 % 34.6(L) 31.6(L) 29.6(L)  Platelets 150 - 400 K/uL 208 43(L) 43(L)      CMP     Component Value Date/Time   NA 136 10/23/2020 1403   K 3.7 10/23/2020 1403   CL 101 10/23/2020 1403   CO2 30 10/23/2020 1403   GLUCOSE 87 10/23/2020 1403   BUN 6 (L) 10/23/2020 1403   CREATININE 0.80 10/23/2020 1403   CALCIUM 8.3 (L) 10/23/2020 1403   PROT 5.7 (L) 10/23/2020 1431   ALBUMIN 2.5 (L) 10/23/2020 1431   AST 48 (H) 10/23/2020 1431   ALT 32 10/23/2020 1431   ALKPHOS 106 10/23/2020 1431   BILITOT 0.9 10/23/2020 1431   GFRNONAA >60 10/23/2020 1403     No results found.     Assessment & Plan:   1. Leg swelling I discussed with the patient the pathophysiology of leg swelling and the differences regarding local causes such as with venous  insufficiency or systemic causes such as with liver disease or heart failure.  I suspect the majority of the patient's swelling is caused by his liver disease as a Lasix cleared of his swelling within a few short days.  However we cannot rule out any venous insufficiency.  By the patient return to the office with noninvasive studies at his convenience to evaluate for possible venous insufficiency.  2. Acute alcoholic liver disease This may be a component and partial cause of the swelling.  It is previously noted that the patient has some cirrhosis and had some noted edema in his abdomen as well.  The patient has upcoming follow-up with GI as well as PCP.  3. Numbness and tingling of both feet The patient has good palpable pulses today however he notes feeling as if his feet are cold with numbness and tingling.  We will check ABIs to ensure there is no arterial component to the patient's numbness and tingling.  Based on his description of symptoms this may be related to neuropathy.  There is no evidence of PAD noted and he will need further work-up by his PCP.   Current Outpatient Medications on File Prior to Visit  Medication Sig Dispense Refill   folic acid (FOLVITE) 1 MG tablet Take 1 tablet (1 mg total) by mouth daily. 30 tablet 2   magnesium citrate SOLN Take 148 mLs (0.5 Bottles total) by mouth 2 (two) times daily. 195 mL 0   Multiple Vitamins-Minerals (MULTIVITAMIN WITH MINERALS) tablet Take 1 tablet by mouth daily. 30 tablet 2   polyethylene glycol (MIRALAX) 17 g packet Take 17 g by mouth daily. 14 each 0   thiamine 100 MG tablet Take 1 tablet (100 mg total) by mouth daily. 30 tablet 2   furosemide (LASIX) 40 MG tablet Take 1 tablet (40 mg total) by mouth daily for 5 days. 5 tablet 0   No current facility-administered medications on file prior to visit.    There are no Patient Instructions on file for this visit. No follow-ups on file.   Kris Hartmann, NP

## 2020-12-02 DIAGNOSIS — G621 Alcoholic polyneuropathy: Secondary | ICD-10-CM | POA: Diagnosis not present

## 2020-12-02 DIAGNOSIS — K7031 Alcoholic cirrhosis of liver with ascites: Secondary | ICD-10-CM | POA: Insufficient documentation

## 2020-12-02 DIAGNOSIS — Z1159 Encounter for screening for other viral diseases: Secondary | ICD-10-CM | POA: Diagnosis not present

## 2020-12-02 DIAGNOSIS — N62 Hypertrophy of breast: Secondary | ICD-10-CM | POA: Diagnosis not present

## 2020-12-15 ENCOUNTER — Other Ambulatory Visit: Payer: Self-pay

## 2020-12-15 ENCOUNTER — Encounter: Payer: Self-pay | Admitting: Gastroenterology

## 2020-12-15 ENCOUNTER — Ambulatory Visit (INDEPENDENT_AMBULATORY_CARE_PROVIDER_SITE_OTHER): Payer: BC Managed Care – PPO | Admitting: Gastroenterology

## 2020-12-15 VITALS — BP 152/76 | HR 88 | Temp 97.3°F | Wt 165.0 lb

## 2020-12-15 DIAGNOSIS — I8500 Esophageal varices without bleeding: Secondary | ICD-10-CM

## 2020-12-15 DIAGNOSIS — K7031 Alcoholic cirrhosis of liver with ascites: Secondary | ICD-10-CM

## 2020-12-15 DIAGNOSIS — N62 Hypertrophy of breast: Secondary | ICD-10-CM

## 2020-12-15 MED ORDER — ALBUMIN HUMAN 25 % IV SOLN: 25.0000 g | Freq: Once | INTRAVENOUS | Status: DC

## 2020-12-15 MED ORDER — LACTULOSE 10 GM/15ML PO SOLN: 10.0000 g | Freq: Two times a day (BID) | ORAL | 11 refills | Status: DC

## 2020-12-15 NOTE — Addendum Note (Signed)
Addended by: Adela Ports on: 12/15/2020 03:11 PM   Modules accepted: Orders

## 2020-12-15 NOTE — Progress Notes (Signed)
Wyline Mood MD, MRCP(U.K) [address]  [address]  [place], [place] [address]  Main: [phone]  Fax: [phone]   Gastroenterology Consultation  Referring Provider:     Noralee Stain, DO Primary Care Physician:  System, Provider Not In Primary Gastroenterologist:  Dr. Wyline Mood  Reason for Consultation:     Cirrhosis of the liver  HPI:   Paul Willis is a 65 y.o. y/o male referred for cirrhosis of the liver the patient was admitted on 10/10/2020 with shortness of breath.  History of alcohol abuse.   CT abdomen 10/09/2020 showed underlying cirrhosis small to moderate volume ascites, gynecomastia and outpatient mammogram was recommended.  10/23/2020: Hemoglobin 11.9 g and MCV 107 albumin of 2.5, total bilirubin of 0.9 creatinine of 0.8.  INR 1.1 B12 levels normal.   He states that he drink a lot of alcohol on a daily basis for many years but quit in September and has not gone back.  Denies any illegal drug use.  No incarceration.  Denies any tattoos.  He states that he has developed swelling of his feet and abdominal distention.  He is not on a low-salt diet.  He said he received diuretics for a few days but has been stopped.  He feels a bit confused at times.  Having 1-2 bowel movements per day not very soft.  Denies any NSAID use.  Past Medical History:  Diagnosis Date   Acute MI (HCC)    Asthma     Past Surgical History:  Procedure Laterality Date   CARDIAC SURGERY      Prior to Admission medications   Medication Sig Start Date End Date Taking? Authorizing Provider  folic acid (FOLVITE) 1 MG tablet Take 1 tablet (1 mg total) by mouth daily. 10/12/20   Noralee Stain, DO  furosemide (LASIX) 40 MG tablet Take 1 tablet (40 mg total) by mouth daily for 5 days. 10/23/20 10/28/20  Cuthriell, Delorise Royals, PA-C  gabapentin (NEURONTIN) 100 MG capsule Take 100 mg by mouth at bedtime. 12/02/20   [provider]  magnesium citrate SOLN Take 148 mLs (0.5 Bottles total)  by mouth 2 (two) times daily. 10/16/20   Dionne Bucy, MD  Multiple Vitamins-Minerals (MULTIVITAMIN WITH MINERALS) tablet Take 1 tablet by mouth daily. 10/11/20 01/09/21  Noralee Stain, DO  polyethylene glycol (MIRALAX) 17 g packet Take 17 g by mouth daily. 10/16/20   Dionne Bucy, MD  thiamine 100 MG tablet Take 1 tablet (100 mg total) by mouth daily. 10/12/20   Noralee Stain, DO    No family history on file.   Social History   Tobacco Use   Smoking status: Never   Smokeless tobacco: Never    Allergies as of 12/15/2020   (No Known Allergies)    Review of Systems:    All systems reviewed and negative except where noted in HPI.   Physical Exam:  BP (!) 152/76   Pulse 88   Temp (!) 97.3 F (36.3 C) (Oral)   Wt 165 lb (74.8 kg)   BMI 24.37 kg/m  No LMP for male patient. Psych:  Alert and cooperative. Normal mood and affect. General:   Alert,  Well-developed, well-nourished, pleasant and cooperative in NAD Head:  Normocephalic and atraumatic. Eyes:  Sclera clear, no icterus.   Conjunctiva pink. Ears:  Normal auditory acuity. Nose:  No deformity, discharge, or lesions. Mouth:  No deformity or lesions,oropharynx pink & moist. Neck:  Supple; no masses or thyromegaly. Abdomen: Mild distention flanks  are full.  Free fluid present.  Tenderness no guarding or rigidity Extremities: 3+ pitting edema to the middle of the shins Neurologic:  Alert and oriented x3;  grossly normal neurologically. Skin:  Intact without significant lesions or rashes. No jaundice. Lymph Nodes:  No significant cervical adenopathy. Psych:  Alert and cooperative. Normal mood and affect.  Imaging Studies: No results found.  Assessment and Plan:   Paul Willis is a 65 y.o. y/o male has been referred for new diagnosis of liver cirrhosis incidentally found on imaging.  History of chronic alcohol consumption which is likely the underlying cause.  Decompensated with ascites.  Concern for gynecomastia  and mammogram and been recommended for the CT scan.  Has been seen by his doctor for the gynecomastia patient is deferred ultrasound at this point of time.  He does have features of hepatic encephalopathy.  Pedal edema likely secondary to combination of low albumin and fluid retention   Plan 1.  Low-salt diet 2.  Stop all alcohol suggested on alcoholic Anonymous 3.  EGD to screen for esophageal varices 4.  In March 2023.  Right upper quadrant ultrasound to screen for HCC 5.  Full autoimmune and viral hepatitis work-up 6.  Labs to calculate meld score 7.  Ascites will obtain diagnostic paracentesis to calculate SAAG gradient following which I will discuss with him about the dosage of furosemide and eplerenone which he should commence.  I would avoid Aldactone due to the effects of gynecomastia. 8.  Advised him to consume a high-protein diet and keep his feet elevated at night for the pedal edema.   I have discussed alternative options, risks & benefits,  which include, but are not limited to, bleeding, infection, perforation,respiratory complication & drug reaction.  The patient agrees with this plan & written consent will be obtained.     Follow up in 6 weeks  Dr Wyline Mood MD,MRCP(U.K)

## 2020-12-15 NOTE — Patient Instructions (Signed)
Low-Sodium Eating Plan Sodium, which is an element that makes up salt, helps you maintain a healthy balance of fluids in your body. Too much sodium can increase your blood pressure and cause fluid and waste to be held in your body. Your health care provider or dietitian may recommend following this plan if you have high blood pressure (hypertension), kidney disease, liver disease, or heart failure. Eating less sodium can help lower your blood pressure, reduce swelling, and protect your heart, liver, and kidneys. What are tips for following this plan? Reading food labels The Nutrition Facts label lists the amount of sodium in one serving of the food. If you eat more than one serving, you must multiply the listed amount of sodium by the number of servings. Choose foods with less than 140 mg of sodium per serving. Avoid foods with 300 mg of sodium or more per serving. Shopping  Look for lower-sodium products, often labeled as "low-sodium" or "no salt added." Always check the sodium content, even if foods are labeled as "unsalted" or "no salt added." Buy fresh foods. Avoid canned foods and pre-made or frozen meals. Avoid canned, cured, or processed meats. Buy breads that have less than 80 mg of sodium per slice. Cooking  Eat more home-cooked food and less restaurant, buffet, and fast food. Avoid adding salt when cooking. Use salt-free seasonings or herbs instead of table salt or sea salt. Check with your health care provider or pharmacist before using salt substitutes. Cook with plant-based oils, such as canola, sunflower, or olive oil. Meal planning When eating at a restaurant, ask that your food be prepared with less salt or no salt, if possible. Avoid dishes labeled as brined, pickled, cured, smoked, or made with soy sauce, miso, or teriyaki sauce. Avoid foods that contain MSG (monosodium glutamate). MSG is sometimes added to Chinese food, bouillon, and some canned foods. Make meals that can  be grilled, baked, poached, roasted, or steamed. These are generally made with less sodium. General information Most people on this plan should limit their sodium intake to 1,500-2,000 mg (milligrams) of sodium each day. What foods should I eat? Fruits Fresh, frozen, or canned fruit. Fruit juice. Vegetables Fresh or frozen vegetables. "No salt added" canned vegetables. "No salt added" tomato sauce and paste. Low-sodium or reduced-sodium tomato and vegetable juice. Grains Low-sodium cereals, including oats, puffed wheat and rice, and shredded wheat. Low-sodium crackers. Unsalted rice. Unsalted pasta. Low-sodium bread. Whole-grain breads and whole-grain pasta. Meats and other proteins Fresh or frozen (no salt added) meat, poultry, seafood, and fish. Low-sodium canned tuna and salmon. Unsalted nuts. Dried peas, beans, and lentils without added salt. Unsalted canned beans. Eggs. Unsalted nut butters. Dairy Milk. Soy milk. Cheese that is naturally low in sodium, such as ricotta cheese, fresh mozzarella, or Swiss cheese. Low-sodium or reduced-sodium cheese. Cream cheese. Yogurt. Seasonings and condiments Fresh and dried herbs and spices. Salt-free seasonings. Low-sodium mustard and ketchup. Sodium-free salad dressing. Sodium-free light mayonnaise. Fresh or refrigerated horseradish. Lemon juice. Vinegar. Other foods Homemade, reduced-sodium, or low-sodium soups. Unsalted popcorn and pretzels. Low-salt or salt-free chips. The items listed above may not be a complete list of foods and beverages you can eat. Contact a dietitian for more information. What foods should I avoid? Vegetables Sauerkraut, pickled vegetables, and relishes. Olives. French fries. Onion rings. Regular canned vegetables (not low-sodium or reduced-sodium). Regular canned tomato sauce and paste (not low-sodium or reduced-sodium). Regular tomato and vegetable juice (not low-sodium or reduced-sodium). Frozen vegetables in  sauces. Grains   Instant hot cereals. Bread stuffing, pancake, and biscuit mixes. Croutons. Seasoned rice or pasta mixes. Noodle soup cups. Boxed or frozen macaroni and cheese. Regular salted crackers. Self-rising flour. Meats and other proteins Meat or fish that is salted, canned, smoked, spiced, or pickled. Precooked or cured meat, such as sausages or meat loaves. Bacon. Ham. Pepperoni. Hot dogs. Corned beef. Chipped beef. Salt pork. Jerky. Pickled herring. Anchovies and sardines. Regular canned tuna. Salted nuts. Dairy Processed cheese and cheese spreads. Hard cheeses. Cheese curds. Blue cheese. Feta cheese. String cheese. Regular cottage cheese. Buttermilk. Canned milk. Fats and oils Salted butter. Regular margarine. Ghee. Bacon fat. Seasonings and condiments Onion salt, garlic salt, seasoned salt, table salt, and sea salt. Canned and packaged gravies. Worcestershire sauce. Tartar sauce. Barbecue sauce. Teriyaki sauce. Soy sauce, including reduced-sodium. Steak sauce. Fish sauce. Oyster sauce. Cocktail sauce. Horseradish that you find on the shelf. Regular ketchup and mustard. Meat flavorings and tenderizers. Bouillon cubes. Hot sauce. Pre-made or packaged marinades. Pre-made or packaged taco seasonings. Relishes. Regular salad dressings. Salsa. Other foods Salted popcorn and pretzels. Corn chips and puffs. Potato and tortilla chips. Canned or dried soups. Pizza. Frozen entrees and pot pies. The items listed above may not be a complete list of foods and beverages you should avoid. Contact a dietitian for more information. Summary Eating less sodium can help lower your blood pressure, reduce swelling, and protect your heart, liver, and kidneys. Most people on this plan should limit their sodium intake to 1,500-2,000 mg (milligrams) of sodium each day. Canned, boxed, and frozen foods are high in sodium. Restaurant foods, fast foods, and pizza are also very high in sodium. You also get sodium by  adding salt to food. Try to cook at home, eat more fresh fruits and vegetables, and eat less fast food and canned, processed, or prepared foods. This information is not intended to replace advice given to you by your health care provider. Make sure you discuss any questions you have with your health care provider. Document Revised: 02/06/2019 Document Reviewed: 12/03/2018 Elsevier Patient Education  2022 [employer] Inc[employer]  

## 2020-12-16 ENCOUNTER — Ambulatory Visit
Admission: RE | Admit: 2020-12-16 | Discharge: 2020-12-16 | Disposition: A | Discharge status: Home / Self Care | Payer: BC Managed Care – PPO | Source: Ambulatory Visit | Attending: Gastroenterology | Admitting: Gastroenterology

## 2020-12-16 ENCOUNTER — Other Ambulatory Visit: Payer: Self-pay | Admitting: Gastroenterology

## 2020-12-16 ENCOUNTER — Ambulatory Visit (INDEPENDENT_AMBULATORY_CARE_PROVIDER_SITE_OTHER): Payer: BC Managed Care – PPO | Admitting: Nurse Practitioner

## 2020-12-16 ENCOUNTER — Ambulatory Visit (INDEPENDENT_AMBULATORY_CARE_PROVIDER_SITE_OTHER): Payer: BC Managed Care – PPO

## 2020-12-16 ENCOUNTER — Telehealth: Payer: Self-pay | Admitting: Gastroenterology

## 2020-12-16 ENCOUNTER — Other Ambulatory Visit: Payer: Self-pay

## 2020-12-16 DIAGNOSIS — K7031 Alcoholic cirrhosis of liver with ascites: Secondary | ICD-10-CM

## 2020-12-16 DIAGNOSIS — R188 Other ascites: Secondary | ICD-10-CM | POA: Diagnosis not present

## 2020-12-16 MED ORDER — ALBUMIN HUMAN 25 % IV SOLN: 25.0000 g | Freq: Once | INTRAVENOUS | Status: DC

## 2020-12-16 NOTE — Telephone Encounter (Signed)
Patient wants to change procedure date to jan 5th on a thurs

## 2020-12-19 NOTE — Telephone Encounter (Signed)
Called patient back to let him know that I will be changing his procedure to 01/19/2021 per his request. Patient had no questions for me.

## 2020-12-20 LAB — COMPREHENSIVE METABOLIC PANEL
ALT: 17 IU/L (ref 0–44)
AST: 31 IU/L (ref 0–40)
Albumin/Globulin Ratio: 1 — ABNORMAL LOW (ref 1.2–2.2)
Albumin: 3.1 g/dL — ABNORMAL LOW (ref 3.8–4.8)
Alkaline Phosphatase: 91 IU/L (ref 44–121)
BUN/Creatinine Ratio: 6 — ABNORMAL LOW (ref 10–24)
BUN: 6 mg/dL — ABNORMAL LOW (ref 8–27)
Bilirubin Total: 0.4 mg/dL (ref 0.0–1.2)
CO2: 28 mmol/L (ref 20–29)
Calcium: 9 mg/dL (ref 8.6–10.2)
Chloride: 99 mmol/L (ref 96–106)
Creatinine, Ser: 0.97 mg/dL (ref 0.76–1.27)
Globulin, Total: 3 g/dL (ref 1.5–4.5)
Glucose: 108 mg/dL — ABNORMAL HIGH (ref 70–99)
Potassium: 4 mmol/L (ref 3.5–5.2)
Sodium: 137 mmol/L (ref 134–144)
Total Protein: 6.1 g/dL (ref 6.0–8.5)
eGFR: 88 mL/min/{1.73_m2} (ref >59)

## 2020-12-20 LAB — MITOCHONDRIAL/SMOOTH MUSCLE AB PNL
Mitochondrial Ab: <20 Units (ref 0.0–20.0)
Smooth Muscle Ab: 13 Units (ref 0–19)

## 2020-12-20 LAB — PROTIME-INR
INR: 0.9 (ref 0.9–1.2)
Prothrombin Time: 9.9 s (ref 9.1–12.0)

## 2020-12-20 LAB — HEPATITIS C ANTIBODY: Hep C Virus Ab: 0.2 s/co ratio (ref 0.0–0.9)

## 2020-12-20 LAB — HIV ANTIBODY (ROUTINE TESTING W REFLEX): HIV Screen 4th Generation wRfx: NONREACTIVE

## 2020-12-20 LAB — HEPATITIS B SURFACE ANTIBODY,QUALITATIVE: Hep B Surface Ab, Qual: NONREACTIVE

## 2020-12-20 LAB — IRON,TIBC AND FERRITIN PANEL
Ferritin: 403 ng/mL — ABNORMAL HIGH (ref 30–400)
Iron Saturation: 19 % (ref 15–55)
Iron: 38 ug/dL (ref 38–169)
Total Iron Binding Capacity: 197 ug/dL — ABNORMAL LOW (ref 250–450)
UIBC: 159 ug/dL (ref 111–343)

## 2020-12-20 LAB — HEPATITIS A ANTIBODY, TOTAL: hep A Total Ab: NEGATIVE

## 2020-12-20 LAB — CELIAC DISEASE AB SCREEN W/RFX
Antigliadin Abs, IgA: 7 units (ref 0–19)
Transglutaminase IgA: <2 U/mL (ref 0–3)

## 2020-12-20 LAB — IMMUNOGLOBULINS A/E/G/M, SERUM
IgA/Immunoglobulin A, Serum: 417 mg/dL (ref 61–437)
IgE (Immunoglobulin E), Serum: 8201 IU/mL — ABNORMAL HIGH (ref 6–495)
IgG (Immunoglobin G), Serum: 1399 mg/dL (ref 603–1613)
IgM (Immunoglobulin M), Srm: 147 mg/dL (ref 20–172)

## 2020-12-20 LAB — HEPATITIS B SURFACE ANTIGEN: Hepatitis B Surface Ag: NEGATIVE

## 2020-12-20 LAB — CERULOPLASMIN: Ceruloplasmin: 19.4 mg/dL (ref 16.0–31.0)

## 2020-12-20 LAB — HEPATITIS B CORE ANTIBODY, TOTAL: Hep B Core Total Ab: NEGATIVE

## 2020-12-20 LAB — ANA: Anti Nuclear Antibody (ANA): NEGATIVE

## 2020-12-20 LAB — HEPATITIS B E ANTIBODY: Hep B E Ab: NEGATIVE

## 2020-12-20 LAB — ALPHA-1-ANTITRYPSIN: A-1 Antitrypsin: 146 mg/dL (ref 101–187)

## 2020-12-20 LAB — CK: Total CK: 183 U/L (ref 41–331)

## 2020-12-20 LAB — HEPATITIS B E ANTIGEN: Hep B E Ag: NEGATIVE

## 2020-12-20 LAB — ANTI-MICROSOMAL ANTIBODY LIVER / KIDNEY: LKM1 Ab: 0.8 Units (ref 0.0–20.0)

## 2020-12-26 ENCOUNTER — Telehealth: Payer: Self-pay

## 2020-12-26 NOTE — Telephone Encounter (Signed)
Patient was notified and stated that he will get his first one when he comes in for his follow up appointment.

## 2020-12-26 NOTE — Telephone Encounter (Signed)
-----   Message from Wyline Mood, MD sent at 12/19/2020  2:48 PM EST ----- Needs hepatitis A and B vaccine as he is not immune

## 2021-01-19 ENCOUNTER — Ambulatory Visit: Payer: BC Managed Care – PPO | Admitting: Anesthesiology

## 2021-01-19 ENCOUNTER — Encounter
Admission: RE | Disposition: A | Discharge status: Home / Self Care | Payer: Self-pay | Source: Home / Self Care | Attending: Gastroenterology

## 2021-01-19 ENCOUNTER — Encounter: Payer: Self-pay | Admitting: Gastroenterology

## 2021-01-19 ENCOUNTER — Ambulatory Visit
Admission: RE | Admit: 2021-01-19 | Discharge: 2021-01-19 | Disposition: A | Discharge status: Home / Self Care | Payer: BC Managed Care – PPO | Attending: Gastroenterology | Admitting: Gastroenterology

## 2021-01-19 ENCOUNTER — Other Ambulatory Visit: Payer: Self-pay

## 2021-01-19 DIAGNOSIS — K7030 Alcoholic cirrhosis of liver without ascites: Secondary | ICD-10-CM

## 2021-01-19 DIAGNOSIS — I252 Old myocardial infarction: Secondary | ICD-10-CM | POA: Insufficient documentation

## 2021-01-19 DIAGNOSIS — K7460 Unspecified cirrhosis of liver: Secondary | ICD-10-CM | POA: Diagnosis not present

## 2021-01-19 DIAGNOSIS — J45909 Unspecified asthma, uncomplicated: Secondary | ICD-10-CM | POA: Insufficient documentation

## 2021-01-19 DIAGNOSIS — K766 Portal hypertension: Secondary | ICD-10-CM | POA: Diagnosis not present

## 2021-01-19 DIAGNOSIS — K3189 Other diseases of stomach and duodenum: Secondary | ICD-10-CM | POA: Diagnosis not present

## 2021-01-19 DIAGNOSIS — I8500 Esophageal varices without bleeding: Secondary | ICD-10-CM

## 2021-01-19 DIAGNOSIS — I2510 Atherosclerotic heart disease of native coronary artery without angina pectoris: Secondary | ICD-10-CM | POA: Diagnosis not present

## 2021-01-19 HISTORY — PX: ESOPHAGOGASTRODUODENOSCOPY (EGD) WITH PROPOFOL: SHX5813

## 2021-01-19 SURGERY — ESOPHAGOGASTRODUODENOSCOPY (EGD) WITH PROPOFOL
Anesthesia: General

## 2021-01-19 MED ORDER — SODIUM CHLORIDE 0.9 % IV SOLN: INTRAVENOUS | Status: DC

## 2021-01-19 MED ORDER — PROPOFOL 500 MG/50ML IV EMUL
INTRAVENOUS | Status: DC | PRN
  Administered 2021-01-19: 150 ug/kg/min via INTRAVENOUS

## 2021-01-19 MED ORDER — PROPOFOL 500 MG/50ML IV EMUL
INTRAVENOUS | Status: AC
  Filled 2021-01-19: qty 50

## 2021-01-19 MED ORDER — LIDOCAINE HCL (PF) 2 % IJ SOLN
INTRAMUSCULAR | Status: AC
  Filled 2021-01-19: qty 5

## 2021-01-19 NOTE — H&P (Signed)
Wyline Mood, MD [address], [address], [place], [place], [address] [address], [address], [place], [place], [address] Phone: [phone]  Fax: [phone]  Primary Care Physician:  Marisue Ivan, MD   Pre-Procedure History & Physical: HPI:  Paul Willis is a 66 y.o. male is here for an endoscopy    Past Medical History:  Diagnosis Date   Acute MI (HCC)    Asthma     Past Surgical History:  Procedure Laterality Date   CARDIAC SURGERY     HERNIA REPAIR Bilateral    age 66    Prior to Admission medications   Medication Sig Start Date End Date Taking? Authorizing Provider  folic acid (FOLVITE) 1 MG tablet Take 1 tablet (1 mg total) by mouth daily. 10/12/20   Noralee Stain, DO  lactulose (CHRONULAC) 10 GM/15ML solution Take 15 mLs (10 g total) by mouth 2 (two) times daily. 12/15/20   Wyline Mood, MD  magnesium citrate SOLN Take 148 mLs (0.5 Bottles total) by mouth 2 (two) times daily. 10/16/20   Dionne Bucy, MD  polyethylene glycol (MIRALAX) 17 g packet Take 17 g by mouth daily. Patient not taking: Reported on 12/15/2020 10/16/20   Dionne Bucy, MD  thiamine 100 MG tablet Take 1 tablet (100 mg total) by mouth daily. 10/12/20   Noralee Stain, DO    Allergies as of 12/15/2020   (No Known Allergies)    History reviewed. No pertinent family history.  Social History   Socioeconomic History   Marital status: Single    Spouse name: Not on file   Number of children: Not on file   Years of education: Not on file   Highest education level: Not on file  Occupational History   Not on file  Tobacco Use   Smoking status: Never   Smokeless tobacco: Never  Substance and Sexual Activity   Alcohol use: Not on file   Drug use: Not on file   Sexual activity: Not on file  Other Topics Concern   Not on file  Social History Narrative   Not on file   Social Determinants of Health   Financial Resource Strain: Not on file  Food Insecurity: Not on file   Transportation Needs: Not on file  Physical Activity: Not on file  Stress: Not on file  Social Connections: Not on file  Intimate Partner Violence: Not on file    Review of Systems: See HPI, otherwise negative ROS  Physical Exam: BP (!) 142/79    Pulse 79    Temp 98.4 F (36.9 C) (Temporal)    Resp 20    Ht 5\' 9"  (1.753 m)    Wt 65.8 kg    SpO2 100%    BMI 21.41 kg/m  General:   Alert,  pleasant and cooperative in NAD Head:  Normocephalic and atraumatic. Neck:  Supple; no masses or thyromegaly. Lungs:  Clear throughout to auscultation, normal respiratory effort.    Heart:  +S1, +S2, Regular rate and rhythm, No edema. Abdomen:  Soft, nontender and nondistended. Normal bowel sounds, without guarding, and without rebound.   Neurologic:  Alert and  oriented x4;  grossly normal neurologically.  Impression/Plan: is here for an endoscopy  to be performed for  evaluation of esophageal varices    Risks, benefits, limitations, and alternatives regarding endoscopy have been reviewed with the patient.  Questions have been answered.  All parties agreeable.   Standley Brooking, MD  01/19/2021, 9:03 AM

## 2021-01-19 NOTE — Anesthesia Postprocedure Evaluation (Signed)
Anesthesia Post Note  Patient: Standley Brooking  Procedure(s) Performed: ESOPHAGOGASTRODUODENOSCOPY (EGD) WITH PROPOFOL  Patient location during evaluation: Endoscopy Anesthesia Type: General Level of consciousness: awake and alert Pain management: pain level controlled Vital Signs Assessment: post-procedure vital signs reviewed and stable Respiratory status: spontaneous breathing, nonlabored ventilation, respiratory function stable and patient connected to nasal cannula oxygen Cardiovascular status: blood pressure returned to baseline and stable Postop Assessment: no apparent nausea or vomiting Anesthetic complications: no   No notable events documented.   Last Vitals:  Vitals:   01/19/21 0930 01/19/21 0940  BP: 115/86 139/75  Pulse: 72 68  Resp: 16 17  Temp:    SpO2: 100% 100%    Last Pain:  Vitals:   01/19/21 0920  TempSrc: Temporal  PainSc:                  Cleda Mccreedy Adriona Kaney

## 2021-01-19 NOTE — Op Note (Signed)
[facility] Patient Name: Paul Willis Procedure Date: 01/19/2021 9:03 AM MRN: [id number] Account #: [id number] Date of Birth: May 23, 1955 Admit Type: Outpatient Age: 66 Room: [facility] ENDO ROOM 3 Gender: Male Note Status: Finalized Instrument Name: Upper Endoscope [id number] Procedure:             Upper GI endoscopy Indications:           Cirrhosis rule out esophageal varices Providers:             Wyline Mood MD, MD Referring MD:          Marisue Ivan (Referring MD) Medicines:             Monitored Anesthesia Care Complications:         No immediate complications. Procedure:             Pre-Anesthesia Assessment:                        - Prior to the procedure, a History and Physical was                         performed, and patient medications, allergies and                         sensitivities were reviewed. The patient's tolerance                         of previous anesthesia was reviewed.                        - The risks and benefits of the procedure and the                         sedation options and risks were discussed with the                         patient. All questions were answered and informed                         consent was obtained.                        - ASA Grade Assessment: III - A patient with severe                         systemic disease.                        After obtaining informed consent, the endoscope was                         passed under direct vision. Throughout the procedure,                         the patient's blood pressure, pulse, and oxygen                         saturations were monitored continuously. The Endoscope  was introduced through the mouth, and advanced to the                         third part of duodenum. The upper GI endoscopy was                         accomplished with ease. The patient tolerated the                         procedure well. Findings:       The esophagus was normal.      The examined duodenum was normal.      Moderate portal hypertensive gastropathy was found in the entire       examined stomach.      The cardia and gastric fundus were normal on retroflexion. Impression:            - Normal esophagus.                        - Normal examined duodenum.                        - Portal hypertensive gastropathy.                        - No specimens collected. Recommendation:        - Discharge patient to home (with escort).                        - Resume previous diet.                        - Continue present medications.                        - Repeat upper endoscopy in 2 years for surveillance. Procedure Code(s):     --- Professional ---                        [phone], Esophagogastroduodenoscopy, flexible,                         transoral; diagnostic, including collection of                         specimen(s) by brushing or washing, when performed                         (separate procedure) Diagnosis Code(s):     --- Professional ---                        K76.6, Portal hypertension                        K31.89, Other diseases of stomach and duodenum                        K74.60, Unspecified cirrhosis of liver CPT copyright 2019 American Medical Association. All rights reserved. The codes documented in this report are preliminary and upon coder review may  be revised to meet current compliance requirements. Wyline Mood, MD  Wyline MoodKiran Draya Felker MD, MD 01/19/2021 9:20:05 AM This report has been signed electronically. Number of Addenda: 0 Note Initiated On: 01/19/2021 9:03 AM Estimated Blood Loss:  Estimated blood loss: none.      [facility]

## 2021-01-19 NOTE — Anesthesia Procedure Notes (Signed)
Date/Time: 01/19/2021 9:19 AM Performed by: Vaughan Sine Pre-anesthesia Checklist: Patient identified, Emergency Drugs available, Suction available, Patient being monitored and Timeout performed Patient Re-evaluated:Patient Re-evaluated prior to induction Oxygen Delivery Method: Nasal cannula Preoxygenation: Pre-oxygenation with 100% oxygen Induction Type: IV induction Airway Equipment and Method: Bite block Placement Confirmation: positive ETCO2 and CO2 detector

## 2021-01-19 NOTE — Anesthesia Preprocedure Evaluation (Signed)
Anesthesia Evaluation  Patient identified by MRN, date of birth, ID band Patient awake    Reviewed: Allergy & Precautions, NPO status , Patient's Chart, lab work & pertinent test results  History of Anesthesia Complications Negative for: history of anesthetic complications  Airway Mallampati: III  TM Distance: >3 FB Neck ROM: full    Dental  (+) Chipped   Pulmonary neg shortness of breath, asthma ,    Pulmonary exam normal        Cardiovascular Exercise Tolerance: Good (-) angina+ CAD and + Past MI  Normal cardiovascular exam     Neuro/Psych  Neuromuscular disease negative psych ROS   GI/Hepatic negative GI ROS, (+) Cirrhosis   Esophageal Varices    ,   Endo/Other  negative endocrine ROS  Renal/GU negative Renal ROS  negative genitourinary   Musculoskeletal   Abdominal   Peds  Hematology negative hematology ROS (+)   Anesthesia Other Findings Past Medical History: No date: Acute MI (HCC) No date: Asthma  Past Surgical History: No date: CARDIAC SURGERY No date: HERNIA REPAIR; Bilateral     Comment:  age 66  BMI    Body Mass Index: 21.41 kg/m      Reproductive/Obstetrics negative OB ROS                             Anesthesia Physical Anesthesia Plan  ASA: 3  Anesthesia Plan: General   Post-op Pain Management:    Induction: Intravenous  PONV Risk Score and Plan: Propofol infusion and TIVA  Airway Management Planned: Natural Airway and Nasal Cannula  Additional Equipment:   Intra-op Plan:   Post-operative Plan:   Informed Consent: I have reviewed the patients History and Physical, chart, labs and discussed the procedure including the risks, benefits and alternatives for the proposed anesthesia with the patient or authorized representative who has indicated his/her understanding and acceptance.     Dental Advisory Given  Plan Discussed with: Anesthesiologist,  CRNA and Surgeon  Anesthesia Plan Comments: (Patient consented for risks of anesthesia including but not limited to:  - adverse reactions to medications - risk of airway placement if required - damage to eyes, teeth, lips or other oral mucosa - nerve damage due to positioning  - sore throat or hoarseness - Damage to heart, brain, nerves, lungs, other parts of body or loss of life  Patient voiced understanding.)        Anesthesia Quick Evaluation

## 2021-01-19 NOTE — Transfer of Care (Signed)
Immediate Anesthesia Transfer of Care Note  Patient: Paul Willis  Procedure(s) Performed: ESOPHAGOGASTRODUODENOSCOPY (EGD) WITH PROPOFOL  Patient Location: PACU and Endoscopy Unit  Anesthesia Type:General  Level of Consciousness: awake and patient cooperative  Airway & Oxygen Therapy: Patient Spontanous Breathing  Post-op Assessment: Report given to RN and Post -op Vital signs reviewed and stable  Post vital signs: Reviewed and stable  Last Vitals:  Vitals Value Taken Time  BP 115/87   Temp    Pulse 76   Resp 14   SpO2 100     Last Pain:  Vitals:   01/19/21 0824  TempSrc: Temporal  PainSc: 5          Complications: No notable events documented.

## 2021-01-20 ENCOUNTER — Encounter: Payer: Self-pay | Admitting: Gastroenterology

## 2021-01-26 ENCOUNTER — Ambulatory Visit (INDEPENDENT_AMBULATORY_CARE_PROVIDER_SITE_OTHER): Payer: BC Managed Care – PPO | Admitting: Nurse Practitioner

## 2021-01-26 ENCOUNTER — Ambulatory Visit (INDEPENDENT_AMBULATORY_CARE_PROVIDER_SITE_OTHER): Payer: BC Managed Care – PPO

## 2021-01-26 ENCOUNTER — Encounter (INDEPENDENT_AMBULATORY_CARE_PROVIDER_SITE_OTHER): Payer: Self-pay | Admitting: Nurse Practitioner

## 2021-01-26 ENCOUNTER — Other Ambulatory Visit: Payer: Self-pay

## 2021-01-26 VITALS — BP 154/77 | HR 80 | Resp 16 | Wt 154.0 lb

## 2021-01-26 DIAGNOSIS — R202 Paresthesia of skin: Secondary | ICD-10-CM

## 2021-01-26 DIAGNOSIS — G621 Alcoholic polyneuropathy: Secondary | ICD-10-CM

## 2021-01-26 DIAGNOSIS — M7989 Other specified soft tissue disorders: Secondary | ICD-10-CM

## 2021-01-26 DIAGNOSIS — R200 Anesthesia of skin: Secondary | ICD-10-CM

## 2021-02-02 ENCOUNTER — Ambulatory Visit (INDEPENDENT_AMBULATORY_CARE_PROVIDER_SITE_OTHER): Payer: BC Managed Care – PPO | Admitting: Gastroenterology

## 2021-02-02 ENCOUNTER — Encounter: Payer: Self-pay | Admitting: Gastroenterology

## 2021-02-02 VITALS — BP 146/90 | HR 78 | Temp 98.3°F | Wt 157.6 lb

## 2021-02-02 DIAGNOSIS — K7031 Alcoholic cirrhosis of liver with ascites: Secondary | ICD-10-CM

## 2021-02-02 DIAGNOSIS — R768 Other specified abnormal immunological findings in serum: Secondary | ICD-10-CM

## 2021-02-02 DIAGNOSIS — Z23 Encounter for immunization: Secondary | ICD-10-CM

## 2021-02-02 NOTE — Addendum Note (Signed)
Addended by: Adela Ports on: 02/02/2021 03:04 PM   Modules accepted: Orders

## 2021-02-02 NOTE — Addendum Note (Signed)
Addended by: Adela Ports on: 02/02/2021 03:30 PM   Modules accepted: Orders

## 2021-02-02 NOTE — Progress Notes (Signed)
[name] MD, MRCP(U.K) [address]  [place]  [place], [place] [address]  Main: [phone]  Fax: [phone]   Primary Care Physician: Dion Body, MD  Primary Gastroenterologist:  Dr. [name]   Chief complaint: Follow-up for alcoholic liver cirrhosis   HPI: Paul Willis is a 66 y.o. male   Summary of history :  Initially referred and seen on 12/15/2020 for cirrhosis of the liver.  Admitted in 10/04/2020 with shortness of breath.  Found to have cirrhosis of the liver with ascites, gynecomastia it was found on imaging.  History of excess alcohol consumption for many years but he quit in September 2022 no other high risk behaviors.  Interval history   12/15/2020-02/02/2021  01/19/2021: EGD: Moderate portal hypertensive gastropathy noted no esophageal varices 12/16/2020: Ultrasound ascites shows no intra-abdominal ascites  12/15/2020: INR 0.9 not immune to hepatitis a and B request vaccination negative hepatitis C.  Autoimmune work-up was negative IgE levels were 8200.  Serology was negative.   12/2020 : MELD 6  No confusion , no constipation. Denies any NSAID use. Not drinking any alcohol. Only issue is leg pains for which he has started gabapentin.   Current Outpatient Medications  Medication Sig Dispense Refill   folic acid (FOLVITE) 1 MG tablet Take 1 tablet (1 mg total) by mouth daily. 30 tablet 2   gabapentin (NEURONTIN) 100 MG capsule Take 100 mg by mouth at bedtime.     lactulose (CHRONULAC) 10 GM/15ML solution Take 15 mLs (10 g total) by mouth 2 (two) times daily. 600 mL 11   magnesium citrate SOLN Take 148 mLs (0.5 Bottles total) by mouth 2 (two) times daily. 195 mL 0   polyethylene glycol (MIRALAX) 17 g packet Take 17 g by mouth daily. 14 each 0   thiamine 100 MG tablet Take 1 tablet (100 mg total) by mouth daily. 30 tablet 2   Current Facility-Administered Medications  Medication Dose Route Frequency Provider Last Rate Last Admin   albumin human 25  % solution 25 g  25 g Intravenous Once [name], MD       albumin human 25 % solution 25 g  25 g Intravenous Once [name], MD        Allergies as of 02/02/2021   (No Known Allergies)    ROS:  General: Negative for anorexia, weight loss, fever, chills, fatigue, weakness. ENT: Negative for hoarseness, difficulty swallowing , nasal congestion. CV: Negative for chest pain, angina, palpitations, dyspnea on exertion, peripheral edema.  Respiratory: Negative for dyspnea at rest, dyspnea on exertion, cough, sputum, wheezing.  GI: See history of present illness. GU:  Negative for dysuria, hematuria, urinary incontinence, urinary frequency, nocturnal urination.  Endo: Negative for unusual weight change.    Physical Examination:   BP (!) 146/90    Pulse 78    Temp 98.3 F (36.8 C) (Oral)    Wt 157 lb 9.6 oz (71.5 kg)    BMI 23.27 kg/m   General: Well-nourished, well-developed in no acute distress.  Eyes: No icterus. Conjunctivae pink. Mouth: Oropharyngeal mucosa moist and pink , no lesions erythema or exudate. Neuro: Alert and oriented x 3.  Grossly intact. Skin: Warm and dry, no jaundice.   Psych: Alert and cooperative, normal mood and affect.   Imaging Studies: VAS [place] ABI WITH/WO TBI  Result Date: 01/26/2021  LOWER EXTREMITY DOPPLER STUDY Patient Name:  Emerick Matzinger  Date of Exam:   01/26/2021 Medical Rec #: [id number]  Accession #:    [id number] Date of Birth: 06-Feb-1955   Patient Gender: M Patient Age:   84 years Exam Location:  [facility] Procedure:      VAS [place] ABI WITH/WO TBI Referring Phys: Hortencia Pilar --------------------------------------------------------------------------------  Indications: Rest pain.  Performing Technologist: Almira Coaster RVS  Examination Guidelines: A complete evaluation includes at minimum, Doppler waveform signals and systolic blood pressure reading at the level of bilateral brachial, anterior tibial, and posterior tibial arteries,  when vessel segments are accessible. Bilateral testing is considered an integral part of a complete examination. Photoelectric Plethysmograph (PPG) waveforms and toe systolic pressure readings are included as required and additional duplex testing as needed. Limited examinations for reoccurring indications may be performed as noted.  ABI Findings: +---------+------------------+-----+---------+--------+  Right     Rt Pressure (mmHg) Index Waveform  Comment   +---------+------------------+-----+---------+--------+  Brachial  156                                          +---------+------------------+-----+---------+--------+  ATA       183                      triphasic 1.13      +---------+------------------+-----+---------+--------+  PTA       193                1.19  triphasic           +---------+------------------+-----+---------+--------+  Great Toe 155                0.96  Normal              +---------+------------------+-----+---------+--------+ +---------+------------------+-----+---------+-------+  Left      Lt Pressure (mmHg) Index Waveform  Comment  +---------+------------------+-----+---------+-------+  Brachial  162                                         +---------+------------------+-----+---------+-------+  ATA       191                      triphasic 1.18     +---------+------------------+-----+---------+-------+  PTA       192                1.19  biphasic           +---------+------------------+-----+---------+-------+  Great Toe 164                1.01                     +---------+------------------+-----+---------+-------+ +-------+-----------+-----------+------------+------------+  ABI/TBI Today's ABI Today's TBI Previous ABI Previous TBI  +-------+-----------+-----------+------------+------------+  Right   1.19        .96                                    +-------+-----------+-----------+------------+------------+  Left    1.19        1.01                                    +-------+-----------+-----------+------------+------------+  Summary: Right:  Resting right ankle-brachial index is within normal range. No evidence of significant right lower extremity arterial disease. The right toe-brachial index is normal. Left: Resting left ankle-brachial index is within normal range. No evidence of significant left lower extremity arterial disease. The left toe-brachial index is normal.  *See table(s) above for measurements and observations.  Electronically signed by Hortencia Pilar MD on 01/26/2021 at 2:46:51 PM.    Final    VAS [place] LOWER EXTREMITY VENOUS REFLUX  Result Date: 01/26/2021  Lower Venous Reflux Study Patient Name:  JUSTINN SCHEARER  Date of Exam:   01/26/2021 Medical Rec #: [id number]    Accession #:    [id number] Date of Birth: Feb 20, 1955   Patient Gender: M Patient Age:   28 years Exam Location:  [facility] Procedure:      VAS [place] LOWER EXTREMITY VENOUS REFLUX Referring Phys: Eulogio Ditch --------------------------------------------------------------------------------  Indications: Swelling, and Pain.  Performing Technologist: Almira Coaster RVS  Examination Guidelines: A complete evaluation includes B-mode imaging, spectral Doppler, color Doppler, and power Doppler as needed of all accessible portions of each vessel. Bilateral testing is considered an integral part of a complete examination. Limited examinations for reoccurring indications may be performed as noted. The reflux portion of the exam is performed with the patient in reverse Trendelenburg. Significant venous reflux is defined as >500 ms in the superficial venous system, and >1 second in the deep venous system.  Venous Reflux Times +--------------+---------+------+-----------+------------+--------+  RIGHT          Reflux No Reflux Reflux Time Diameter cms Comments                             Yes                                      +--------------+---------+------+-----------+------------+--------+  CFV             no                                                  +--------------+---------+------+-----------+------------+--------+  FV prox        no                                                  +--------------+---------+------+-----------+------------+--------+  FV mid         no                                                  +--------------+---------+------+-----------+------------+--------+  FV dist        no                                                  +--------------+---------+------+-----------+------------+--------+  Popliteal      no                                                  +--------------+---------+------+-----------+------------+--------+  GSV at [facility]     no                               .52                +--------------+---------+------+-----------+------------+--------+  GSV prox thigh no                               .47                +--------------+---------+------+-----------+------------+--------+  GSV mid thigh  no                               .34                +--------------+---------+------+-----------+------------+--------+  GSV dist thigh no                               .32                +--------------+---------+------+-----------+------------+--------+  GSV at knee    no                               .34                +--------------+---------+------+-----------+------------+--------+  GSV prox calf  no                               .30                +--------------+---------+------+-----------+------------+--------+  SSV Pop Fossa  no                               .22                +--------------+---------+------+-----------+------------+--------+  +--------------+---------+------+-----------+------------+--------+  LEFT           Reflux No Reflux Reflux Time Diameter cms Comments                             Yes                                      +--------------+---------+------+-----------+------------+--------+  CFV            no                                                   +--------------+---------+------+-----------+------------+--------+  FV prox        no                                                  +--------------+---------+------+-----------+------------+--------+  FV mid         no                                                  +--------------+---------+------+-----------+------------+--------+  FV dist        no                                                  +--------------+---------+------+-----------+------------+--------+  Popliteal      no                               .51                +--------------+---------+------+-----------+------------+--------+  GSV at SFJ     no                               .31                +--------------+---------+------+-----------+------------+--------+  GSV prox thigh no                               .33                +--------------+---------+------+-----------+------------+--------+  GSV mid thigh  no                               .37                +--------------+---------+------+-----------+------------+--------+  GSV dist thigh no                               .37                +--------------+---------+------+-----------+------------+--------+  GSV at knee    no                               .25                +--------------+---------+------+-----------+------------+--------+  GSV prox calf  no                               .17                +--------------+---------+------+-----------+------------+--------+  SSV Pop Fossa  no                                                  +--------------+---------+------+-----------+------------+--------+   Summary: Bilateral: - No evidence of deep vein thrombosis seen in the lower extremities, bilaterally, from the common femoral through the popliteal veins. - No evidence of superficial venous thrombosis in the lower extremities, bilaterally. - No evidence of deep venous insufficiency seen bilaterally in the lower extremity. - No evidence of superficial venous reflux  seen in the greater saphenous veins bilaterally. - No evidence of superficial venous reflux seen in the short saphenous veins bilaterally.  Right: - There appears to be a Baker's Cyst seen behind the Right Knee area measuring 1.64 cms x .87 cms.  Left: - There appears to be  a Baker's Cyst seen behind the Left Knee area measuring 1.11 cms x 1.37cms.  *See table(s) above for measurements and observations. Electronically signed by Hortencia Pilar MD on 01/26/2021 at 2:46:23 PM.    Final     Assessment and Plan:   Jerrion Arana is a 66 y.o. y/o male here to follow-up for alcoholic liver cirrhosis with ascites initially which has resolved after stopping alcohol, gynecomastia incidentally found on imaging.  .  Presently no hepatic encephalopathy.  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 12/2020- MELD score 6      Plan 1.  Low-salt diet 2.  Stop all alcohol suggested attend  alcoholic Anonymous 3.  EGD to screen for esophageal varices in January 2025 4.  In March 2023.  Right upper quadrant ultrasound to screen for [place] 5.  Full autoimmune and viral hepatitis work-up 6.  Needs hep A/ B vaccine, Pneumococcal vaccine 7.   No ascites seen on recent imaging and if it were to develop in the future would avoid spironolactone as he has developed gynecomastia and will need to use eplerenone 8.  Elevated IGE , recheck and if still high will refer to hematology .    Dr [name]  MD,MRCP [facility]) Follow up in 6 months

## 2021-02-05 ENCOUNTER — Encounter (INDEPENDENT_AMBULATORY_CARE_PROVIDER_SITE_OTHER): Payer: Self-pay | Admitting: Nurse Practitioner

## 2021-02-05 LAB — IGE: IgE (Immunoglobulin E), Serum: 7712 IU/mL — ABNORMAL HIGH (ref 6–495)

## 2021-02-05 NOTE — Progress Notes (Signed)
Subjective:    Patient ID: Paul Willis, male    DOB: 05/13/1955, 66 y.o.   MRN: [id number] Chief Complaint  Patient presents with   Follow-up    Ultrasound follow up    Paul Willis is a 66 year old male that presents today following a recent emergency room visit.  The patient notes that he has severe swelling of both lower extremities with the left being worse that extended up to his thigh.  The patient has a recent diagnosis of cirrhosis of the liver.  It was noted that he had swelling in his abdomen although with no evidence of acute ascites.  The patient was given several days worth of Lasix and the patient notes that this completely resolved all swelling issues that he had.  However he continues to endorse having numbness and discomfort in his lower extremities.  In fact that is his largest complaint.  He denies any fevers or chills.  There are no claudication-like symptoms.  Today noninvasive studies show an ABI of 1.19 bilaterally.  Patient has triphasic tibial artery waveforms in the right with triphasic/biphasic in the left.  He has normal toe waveforms bilaterally with normal TBI.  There is no evidence of DVT or superficial thrombophlebitis bilaterally.  No evidence of deep venous insufficiency or superficial reflux noted bilaterally.  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   Review of Systems  Skin:  Negative for wound.  All other systems reviewed and are negative.     Objective:   Physical Exam Vitals reviewed.  HENT:     Head: Normocephalic.  Cardiovascular:     Rate and Rhythm: Normal rate.     Pulses: Normal pulses.  Pulmonary:     Effort: Pulmonary effort is normal.  Skin:    General: Skin is warm and dry.  Neurological:     Mental Status: He is alert and oriented to person, place, and time.  Psychiatric:        Mood and Affect: Mood normal.        Behavior: Behavior normal.        Thought Content: Thought content normal.        Judgment: Judgment normal.     BP (!) 154/77 (BP Location: Right Arm)    Pulse 80    Resp 16    Wt 154 lb (69.9 kg)    BMI 22.74 kg/m   Past Medical History:  Diagnosis Date   Acute MI ([place])    Asthma     Social History   Socioeconomic History   Marital status: Single    Spouse name: Not on file   Number of children: Not on file   Years of education: Not on file   Highest education level: Not on file  Occupational History   Not on file  Tobacco Use   Smoking status: Never   Smokeless tobacco: Never  Substance and Sexual Activity   Alcohol use: Not on file   Drug use: Not on file   Sexual activity: Not on file  Other Topics Concern   Not on file  Social History Narrative   Not on file   Social Determinants of Health   Financial Resource Strain: Not on file  Food Insecurity: Not on file  Transportation Needs: Not on file  Physical Activity: Not on file  Stress: Not on file  Social Connections: Not on file  Intimate Partner Violence: Not on file    Past Surgical History:  Procedure Laterality Date  CARDIAC SURGERY     ESOPHAGOGASTRODUODENOSCOPY (EGD) WITH PROPOFOL N/A 01/19/2021   Procedure: ESOPHAGOGASTRODUODENOSCOPY (EGD) WITH PROPOFOL;  Surgeon: [name], MD;  [facility] ENDOSCOPY;  Service: Gastroenterology;  Laterality: N/A;   HERNIA REPAIR Bilateral    age 58    History reviewed. No pertinent family history.  No Known Allergies  CBC Latest Ref Rng & Units 10/23/2020 10/11/2020 10/10/2020  WBC 4.0 - 10.5 K/uL 6.6 2.6(L) 2.3(L)  Hemoglobin 13.0 - 17.0 g/dL 11.9(L) 11.1(L) 10.8(L)  Hematocrit 39.0 - 52.0 % 34.6(L) 31.6(L) 29.6(L)  Platelets 150 - 400 K/uL 208 43(L) 43(L)      CMP     Component Value Date/Time   NA 137 12/15/2020 1446   K 4.0 12/15/2020 1446   CL 99 12/15/2020 1446   CO2 28 12/15/2020 1446   GLUCOSE 108 (H) 12/15/2020 1446   GLUCOSE 87 10/23/2020 1403   BUN 6 (L) 12/15/2020 1446   CREATININE 0.97 12/15/2020 1446   CALCIUM 9.0 12/15/2020 1446   PROT  6.1 12/15/2020 1446   ALBUMIN 3.1 (L) 12/15/2020 1446   AST 31 12/15/2020 1446   ALT 17 12/15/2020 1446   ALKPHOS 91 12/15/2020 1446   BILITOT 0.4 12/15/2020 1446   GFRNONAA >60 10/23/2020 1403     VAS [place] ABI WITH/WO TBI  Result Date: 01/26/2021  LOWER EXTREMITY DOPPLER STUDY Patient Name:  Paul Willis  Date of Exam:   01/26/2021 Medical Rec #: [id number]    Accession #:    [id number] Date of Birth: 12-05-1955   Patient Gender: M Patient Age:   75 years Exam Location:  [facility] Procedure:      VAS [place] ABI WITH/WO TBI Referring Phys: Hortencia Pilar --------------------------------------------------------------------------------  Indications: Rest pain.  Performing Technologist: Almira Coaster RVS  Examination Guidelines: A complete evaluation includes at minimum, Doppler waveform signals and systolic blood pressure reading at the level of bilateral brachial, anterior tibial, and posterior tibial arteries, when vessel segments are accessible. Bilateral testing is considered an integral part of a complete examination. Photoelectric Plethysmograph (PPG) waveforms and toe systolic pressure readings are included as required and additional duplex testing as needed. Limited examinations for reoccurring indications may be performed as noted.  ABI Findings: +---------+------------------+-----+---------+--------+  Right     Rt Pressure (mmHg) Index Waveform  Comment   +---------+------------------+-----+---------+--------+  Brachial  156                                          +---------+------------------+-----+---------+--------+  ATA       183                      triphasic 1.13      +---------+------------------+-----+---------+--------+  PTA       193                1.19  triphasic           +---------+------------------+-----+---------+--------+  Great Toe 155                0.96  Normal              +---------+------------------+-----+---------+--------+  +---------+------------------+-----+---------+-------+  Left      Lt Pressure (mmHg) Index Waveform  Comment  +---------+------------------+-----+---------+-------+  Brachial  162                                         +---------+------------------+-----+---------+-------+  ATA       191                      triphasic 1.18     +---------+------------------+-----+---------+-------+  PTA       192                1.19  biphasic           +---------+------------------+-----+---------+-------+  Great Toe 164                1.01                     +---------+------------------+-----+---------+-------+ +-------+-----------+-----------+------------+------------+  ABI/TBI Today's ABI Today's TBI Previous ABI Previous TBI  +-------+-----------+-----------+------------+------------+  Right   1.19        .96                                    +-------+-----------+-----------+------------+------------+  Left    1.19        1.01                                   +-------+-----------+-----------+------------+------------+  Summary: Right: Resting right ankle-brachial index is within normal range. No evidence of significant right lower extremity arterial disease. The right toe-brachial index is normal. Left: Resting left ankle-brachial index is within normal range. No evidence of significant left lower extremity arterial disease. The left toe-brachial index is normal.  *See table(s) above for measurements and observations.  Electronically signed by Hortencia Pilar MD on 01/26/2021 at 2:46:51 PM.    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  While he is on gabapentin is managed to control his current symptoms.  We will present to neurology for further evaluation and treatment of his neuropathy. - Ambulatory referral to Neurology  2. Numbness and tingling of both feet Noninvasive studies today show that the numbness and tingling is he is  not related to a vascular pathology.  Patient will be referred to neurosurgery as noted above.  3. Leg swelling Patient is advised to continue with conservative therapy including use of medical grade compression stockings, elevation and activity.  Patient should do these activities daily to help with lower extremity he should also attempt to exercise at least 20 to 30 minutes 3 to 4 days a week.   Current Outpatient Medications on File Prior to Visit  Medication Sig Dispense Refill   folic acid (FOLVITE) 1 MG tablet Take 1 tablet (1 mg total) by mouth daily. 30 tablet 2   gabapentin (NEURONTIN) 100 MG capsule Take 100 mg by mouth at bedtime.     lactulose (CHRONULAC) 10 GM/15ML solution Take 15 mLs (10 g total) by mouth 2 (two) times daily. 600 mL 11   magnesium citrate SOLN Take 148 mLs (0.5 Bottles total) by mouth 2 (two) times daily. 195 mL 0   thiamine 100 MG tablet Take 1 tablet (100 mg total) by mouth daily. 30 tablet 2   polyethylene glycol (MIRALAX) 17 g packet Take 17 g by mouth daily. 14 each 0   Current Facility-Administered Medications on File Prior to Visit  Medication Dose Route Frequency Provider Last Rate Last Admin   albumin human 25 % solution  25 g  25 g Intravenous Once [name], MD       albumin human 25 % solution 25 g  25 g Intravenous Once [name], MD        There are no Patient Instructions on file for this visit. No follow-ups on file.   Kris Hartmann, NP

## 2021-02-06 ENCOUNTER — Telehealth: Payer: Self-pay

## 2021-02-06 DIAGNOSIS — R768 Other specified abnormal immunological findings in serum: Secondary | ICD-10-CM

## 2021-02-06 NOTE — Telephone Encounter (Signed)
-----   Message from Wyline Mood, MD sent at 02/06/2021  8:16 AM EST ----- Inform patient that IGE level is very high please refer to hematology

## 2021-02-06 NOTE — Progress Notes (Signed)
Inform patient that IGE level is very high please refer to hematology

## 2021-02-06 NOTE — Telephone Encounter (Signed)
Called patient to let him know the below information. Patient agreed on the hematology referral. I told him that he will be receiving a call soon to get scheduled. Patient had no further questions.

## 2021-02-13 ENCOUNTER — Encounter: Payer: Self-pay | Admitting: *Deleted

## 2021-02-16 ENCOUNTER — Inpatient Hospital Stay: Payer: BC Managed Care – PPO

## 2021-02-16 ENCOUNTER — Encounter: Payer: Self-pay | Admitting: Oncology

## 2021-02-16 ENCOUNTER — Other Ambulatory Visit: Payer: Self-pay

## 2021-02-16 ENCOUNTER — Inpatient Hospital Stay: Payer: BC Managed Care – PPO | Attending: Oncology | Admitting: Oncology

## 2021-02-16 VITALS — BP 137/78 | HR 83 | Temp 98.5°F | Resp 18 | Wt 159.5 lb

## 2021-02-16 DIAGNOSIS — R768 Other specified abnormal immunological findings in serum: Secondary | ICD-10-CM

## 2021-02-16 DIAGNOSIS — Z801 Family history of malignant neoplasm of trachea, bronchus and lung: Secondary | ICD-10-CM | POA: Diagnosis not present

## 2021-02-16 DIAGNOSIS — K7030 Alcoholic cirrhosis of liver without ascites: Secondary | ICD-10-CM | POA: Diagnosis not present

## 2021-02-16 DIAGNOSIS — F17210 Nicotine dependence, cigarettes, uncomplicated: Secondary | ICD-10-CM | POA: Diagnosis not present

## 2021-02-16 LAB — CBC WITH DIFFERENTIAL/PLATELET
Abs Immature Granulocytes: 0.01 10*3/uL (ref 0.00–0.07)
Basophils Absolute: 0 10*3/uL (ref 0.0–0.1)
Basophils Relative: 0 %
Eosinophils Absolute: 0 10*3/uL (ref 0.0–0.5)
Eosinophils Relative: 1 %
HCT: 48.6 % (ref 39.0–52.0)
Hemoglobin: 16.7 g/dL (ref 13.0–17.0)
Immature Granulocytes: 0 %
Lymphocytes Relative: 22 %
Lymphs Abs: 1.2 10*3/uL (ref 0.7–4.0)
MCH: 31 pg (ref 26.0–34.0)
MCHC: 34.4 g/dL (ref 30.0–36.0)
MCV: 90.3 fL (ref 80.0–100.0)
Monocytes Absolute: 0.6 10*3/uL (ref 0.1–1.0)
Monocytes Relative: 10 %
Neutro Abs: 3.8 10*3/uL (ref 1.7–7.7)
Neutrophils Relative %: 67 %
Platelets: 153 10*3/uL (ref 150–400)
RBC: 5.38 MIL/uL (ref 4.22–5.81)
RDW: 15.6 % — ABNORMAL HIGH (ref 11.5–15.5)
WBC: 5.7 10*3/uL (ref 4.0–10.5)
nRBC: 0 % (ref 0.0–0.2)

## 2021-02-16 LAB — COMPREHENSIVE METABOLIC PANEL
ALT: 22 U/L (ref 0–44)
AST: 39 U/L (ref 15–41)
Albumin: 3.7 g/dL (ref 3.5–5.0)
Alkaline Phosphatase: 106 U/L (ref 38–126)
Anion gap: 5 (ref 5–15)
BUN: 8 mg/dL (ref 8–23)
CO2: 29 mmol/L (ref 22–32)
Calcium: 8.2 mg/dL — ABNORMAL LOW (ref 8.9–10.3)
Chloride: 98 mmol/L (ref 98–111)
Creatinine, Ser: 0.99 mg/dL (ref 0.61–1.24)
GFR, Estimated: >60 mL/min (ref >60)
Glucose, Bld: 81 mg/dL (ref 70–99)
Potassium: 3.1 mmol/L — ABNORMAL LOW (ref 3.5–5.1)
Sodium: 132 mmol/L — ABNORMAL LOW (ref 135–145)
Total Bilirubin: 0.3 mg/dL (ref 0.3–1.2)
Total Protein: 7.9 g/dL (ref 6.5–8.1)

## 2021-02-16 LAB — SEDIMENTATION RATE: Sed Rate: 6 mm/hr (ref 0–20)

## 2021-02-16 LAB — C-REACTIVE PROTEIN: CRP: 0.5 mg/dL (ref <1.0)

## 2021-02-16 NOTE — Progress Notes (Signed)
Pt here to establish care.

## 2021-02-17 ENCOUNTER — Other Ambulatory Visit: Payer: Self-pay | Admitting: Family Medicine

## 2021-02-17 DIAGNOSIS — G621 Alcoholic polyneuropathy: Secondary | ICD-10-CM

## 2021-02-17 LAB — KAPPA/LAMBDA LIGHT CHAINS
Kappa free light chain: 53.2 mg/L — ABNORMAL HIGH (ref 3.3–19.4)
Kappa, lambda light chain ratio: 1.26 (ref 0.26–1.65)
Lambda free light chains: 42.2 mg/L — ABNORMAL HIGH (ref 5.7–26.3)

## 2021-02-17 LAB — EBV AB TO VIRAL CAPSID AG PNL, IGG+IGM
EBV VCA IgG: >600 U/mL — ABNORMAL HIGH (ref 0.0–17.9)
EBV VCA IgM: <36 U/mL (ref 0.0–35.9)

## 2021-02-18 LAB — CMV IGM: CMV IgM: <30 AU/mL (ref 0.0–29.9)

## 2021-02-18 NOTE — Progress Notes (Signed)
Hematology/Oncology Consult note [phone] [phone]         Patient Care Team: Dion Body, MD as PCP - General (Family Medicine) Earlie Server, MD as Consulting Physician (Hematology and Oncology) [name], MD as Consulting Physician (Gastroenterology)  REFERRING PROVIDER: [name], MD  CHIEF COMPLAINTS/REASON FOR VISIT:  Evaluation of elevated IgE level  HISTORY OF PRESENTING ILLNESS:   Paul Willis is a  66 y.o.  male with PMH listed below was seen in consultation at the request of  [name], MD  for evaluation of Elevated IgE level.  Patient follows up with gastroenterology Dr. Vicente Males for alcoholic liver cirrhosis.  As part of the liver cirrhosis work-up, immunoglobulin was checked.  Patient has normal IgG, IgA, IgM level and extremely elevated IgG level of 8201. IgG level was repeated in 2 weeks which showed a level of 7712. Patient was referred to establish care with hematology for further evaluation.  Patient reports feeling well at baseline.  He is a someday smoker.  He has a history of alcohol abuse and quit in September 2022.  Patient denies recreational drug use. He reports a history of asthma when he was younger.  After he moved from [place] to [place] many years ago, he has not had any asthma flares.  Denies any skin rash, chronic wound, unintentional weight loss, fever or chills. He denies SOB, cough, wheezing.   Patient was hospitalized from 10/09/2020-10/11/2020, 10/09/2020, patient had a CT angiogram chest/ CT abdomen pelvis with contrast  which showed no PE, profound hepatic steatosis.  Bronchial wall thickening with multifocal endobronchial debris's.  Asymmetrical right retroareolar nodule density likely reflects nodular gynecomastia.   Patient reports that he had some tenderness of right breast.  Tenderness has improved currently.  Some nipple sensitivity  Review of Systems  Constitutional:  Negative for appetite change, chills, fatigue,  fever and unexpected weight change.  HENT:   Negative for hearing loss and voice change.   Eyes:  Negative for eye problems and icterus.  Respiratory:  Negative for chest tightness, cough and shortness of breath.   Cardiovascular:  Negative for chest pain and leg swelling.  Gastrointestinal:  Negative for abdominal distention and abdominal pain.  Endocrine: Negative for hot flashes.  Genitourinary:  Negative for difficulty urinating, dysuria and frequency.   Musculoskeletal:  Negative for arthralgias.  Skin:  Negative for itching and rash.  Neurological:  Negative for light-headedness and numbness.  Hematological:  Negative for adenopathy. Does not bruise/bleed easily.  Psychiatric/Behavioral:  Negative for confusion.    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[place])    Alcoholic cirrhosis of liver with ascites ([facility])    Alcoholism ([facility])    Asthma    Elevated IgE level     SURGICAL HISTORY: Past Surgical History:  Procedure Laterality Date   CARDIAC SURGERY     ESOPHAGOGASTRODUODENOSCOPY (EGD) WITH PROPOFOL N/A 01/19/2021   Procedure: ESOPHAGOGASTRODUODENOSCOPY (EGD) WITH PROPOFOL;  Surgeon: [name], MD;  Location: [facility] END[facility]COPY;  Service: Gastroenterology;  Laterality: N/A;   HERNIA REPAIR Bilateral    age 25    SOCIAL HISTORY: Social History   Socioeconomic History   Marital status: Single    Spouse name: Not on file   Number of children: Not on file   Years of education: Not on file   Highest education level: Not on file  Occupational History   Not on file  Tobacco Use  Smoking status: Some Days    Packs/day: 0.50    Types: Cigarettes    Passive exposure: Never   Smokeless tobacco: Never  Vaping Use   Vaping Use: Never used  Substance and Sexual Activity   Alcohol use: Not Currently   Drug use: Never   Sexual activity: Not on file  Other Topics Concern   Not on file  Social History  Narrative   Not on file   Social Determinants of Health   Financial Resource Strain: Not on file  Food Insecurity: Not on file  Transportation Needs: Not on file  Physical Activity: Not on file  Stress: Not on file  Social Connections: Not on file  Intimate Partner Violence: Not on file    FAMILY HISTORY: Family History  Problem Relation Age of Onset   Lung cancer Mother    Stroke Father    Cancer Maternal Uncle    Cancer Paternal Aunt     ALLERGIES:  has No Known Allergies.  MEDICATIONS:  Current Outpatient Medications  Medication Sig Dispense Refill   folic acid (FOLVITE) 1 MG tablet Take 1 tablet (1 mg total) by mouth daily. 30 tablet 2   gabapentin (NEURONTIN) 100 MG capsule Take 100 mg by mouth at bedtime.     lactulose (CHRONULAC) 10 GM/15ML solution Take 15 mLs (10 g total) by mouth 2 (two) times daily. 600 mL 11   magnesium citrate SOLN Take 148 mLs (0.5 Bottles total) by mouth 2 (two) times daily. 195 mL 0   polyethylene glycol (MIRALAX) 17 g packet Take 17 g by mouth daily. 14 each 0   Potassium 99 MG TABS Take 1 tablet by mouth daily.     thiamine 100 MG tablet Take 1 tablet (100 mg total) by mouth daily. 30 tablet 2   Current Facility-Administered Medications  Medication Dose Route Frequency Provider Last Rate Last Admin   albumin human 25 % solution 25 g  25 g Intravenous Once [name], MD       albumin human 25 % solution 25 g  25 g Intravenous Once [name], MD         PHYSICAL EXAMINATION: ECOG PERFORMANCE STATUS: 0 - Asymptomatic Vitals:   02/16/21 1526  BP: 137/78  Pulse: 83  Resp: 18  Temp: 98.5 F (36.9 C)   Filed Weights   02/16/21 1526  Weight: 159 lb 8 oz (72.3 kg)    Physical Exam Constitutional:      General: He is not in acute distress. HENT:     Head: Normocephalic and atraumatic.  Eyes:     General: No scleral icterus. Cardiovascular:     Rate and Rhythm: Normal rate and regular rhythm.     Heart sounds: Normal heart  sounds.  Pulmonary:     Effort: Pulmonary effort is normal. No respiratory distress.     Breath sounds: No wheezing.  Abdominal:     General: Bowel sounds are normal. There is no distension.     Palpations: Abdomen is soft.  Musculoskeletal:        General: No deformity. Normal range of motion.     Cervical back: Normal range of motion and neck supple.  Skin:    General: Skin is warm and dry.     Findings: No erythema or rash.  Neurological:     Mental Status: He is alert and oriented to person, place, and time. Mental status is at baseline.     Cranial Nerves: No cranial nerve  deficit.     Coordination: Coordination normal.  Psychiatric:        Mood and Affect: Mood normal.  Bilateral breast was examined.  chaperone CMA Amy Tye Savoy No palpable discrete masses in bilateral breast.  LABORATORY DATA:  I have reviewed the data as listed Lab Results  Component Value Date   WBC 5.7 02/16/2021   HGB 16.7 02/16/2021   HCT 48.6 02/16/2021   MCV 90.3 02/16/2021   PLT 153 02/16/2021   Recent Labs    10/09/20 1448 10/10/20 0514 10/11/20 0427 10/23/20 1403 10/23/20 1431 12/15/20 1446 02/16/21 1600  NA 129*   < > 132* 136  --  137 132*  K 2.5*   < > 3.9 3.7  --  4.0 3.1*  CL 80*   < > 89* 101  --  99 98  CO2 34*   < > 36* 30  --  28 29  GLUCOSE 126*   < > 78 87  --  108* 81  BUN 18   < > 11 6*  --  6* 8  CREATININE 1.11   < > 0.93 0.80  --  0.97 0.99  CALCIUM 8.3*   < > 7.9* 8.3*  --  9.0 8.2*  GFRNONAA >60   < > >60 >60  --   --  >60  PROT 5.5*   < > 5.2*  --  5.7* 6.1 7.9  ALBUMIN 2.4*   < > 2.1*  --  2.5* 3.1* 3.7  AST 96*   < > 90*  --  48* 31 39  ALT 49*   < > 44  --  32 17 22  ALKPHOS 141*   < > 114  --  106 91 106  BILITOT 2.3*   < > 1.5*  --  0.9 0.4 0.3  BILIDIR 0.7*  --  0.5*  --  0.2  --   --   IBILI 1.6*  --  1.0*  --  0.7  --   --    < > = values in this interval not displayed.   Iron/TIBC/Ferritin/ %Sat    Component Value Date/Time   IRON 38 12/15/2020  1446   TIBC 197 (L) 12/15/2020 1446   FERRITIN 403 (H) 12/15/2020 1446   IRONPCTSAT 19 12/15/2020 1446      RADIOGRAPHIC STUDIES: I have personally reviewed the radiological images as listed and agreed with the findings in the report. VAS [place] ABI WITH/WO TBI  Result Date: 01/26/2021  LOWER EXTREMITY DOPPLER STUDY Patient Name:  Dorell Gatlin  Date of Exam:   01/26/2021 Medical Rec #: [id number]    Accession #:    [id number] Date of Birth: 1955-11-29   Patient Gender: M Patient Age:   21 years Exam Location:  [facility] Procedure:      VAS [place] ABI WITH/WO TBI Referring Phys: Hortencia Pilar --------------------------------------------------------------------------------  Indications: Rest pain.  Performing Technologist: Almira Coaster RVS  Examination Guidelines: A complete evaluation includes at minimum, Doppler waveform signals and systolic blood pressure reading at the level of bilateral brachial, anterior tibial, and posterior tibial arteries, when vessel segments are accessible. Bilateral testing is considered an integral part of a complete examination. Photoelectric Plethysmograph (PPG) waveforms and toe systolic pressure readings are included as required and additional duplex testing as needed. Limited examinations for reoccurring indications may be performed as noted.  ABI Findings: +---------+------------------+-----+---------+--------+  Right     Rt Pressure (mmHg) Index Waveform  Comment   +---------+------------------+-----+---------+--------+  Brachial  156                                          +---------+------------------+-----+---------+--------+  ATA       183                      triphasic 1.13      +---------+------------------+-----+---------+--------+  PTA       193                1.19  triphasic           +---------+------------------+-----+---------+--------+  Great Toe 155                0.96  Normal              +---------+------------------+-----+---------+--------+  +---------+------------------+-----+---------+-------+  Left      Lt Pressure (mmHg) Index Waveform  Comment  +---------+------------------+-----+---------+-------+  Brachial  162                                         +---------+------------------+-----+---------+-------+  ATA       191                      triphasic 1.18     +---------+------------------+-----+---------+-------+  PTA       192                1.19  biphasic           +---------+------------------+-----+---------+-------+  Great Toe 164                1.01                     +---------+------------------+-----+---------+-------+ +-------+-----------+-----------+------------+------------+  ABI/TBI Today's ABI Today's TBI Previous ABI Previous TBI  +-------+-----------+-----------+------------+------------+  Right   1.19        .96                                    +-------+-----------+-----------+------------+------------+  Left    1.19        1.01                                   +-------+-----------+-----------+------------+------------+  Summary: Right: Resting right ankle-brachial index is within normal range. No evidence of significant right lower extremity arterial disease. The right toe-brachial index is normal. Left: Resting left ankle-brachial index is within normal range. No evidence of significant left lower extremity arterial disease. The left toe-brachial index is normal.  *See table(s) above for measurements and observations.  Electronically signed by Hortencia Pilar MD on 01/26/2021 at 2:46:51 PM.    Final    VAS [place] LOWER EXTREMITY VENOUS REFLUX  Result Date: 01/26/2021  Lower Venous Reflux Study Patient Name:  DMARCUS DECICCO  Date of Exam:   01/26/2021 Medical Rec #: [id number]    Accession #:    [id number] Date of Birth: 1955/05/07   Patient Gender: M Patient Age:   25 years [facility] Procedure:      VAS [place] LOWER EXTREMITY VENOUS REFLUX Referring Phys:  FALLON BROWN  --------------------------------------------------------------------------------  Indications: Swelling, and Pain.  Performing Technologist: Almira Coaster RVS  Examination Guidelines: A complete evaluation includes B-mode imaging, spectral Doppler, color Doppler, and power Doppler as needed of all accessible portions of each vessel. Bilateral testing is considered an integral part of a complete examination. Limited examinations for reoccurring indications may be performed as noted. The reflux portion of the exam is performed with the patient in reverse Trendelenburg. Significant venous reflux is defined as >500 ms in the superficial venous system, and >1 second in the deep venous system.  Venous Reflux Times +--------------+---------+------+-----------+------------+--------+  RIGHT          Reflux No Reflux Reflux Time Diameter cms Comments                             Yes                                      +--------------+---------+------+-----------+------------+--------+  CFV            no                                                  +--------------+---------+------+-----------+------------+--------+  FV prox        no                                                  +--------------+---------+------+-----------+------------+--------+  FV mid         no                                                  +--------------+---------+------+-----------+------------+--------+  FV dist        no                                                  +--------------+---------+------+-----------+------------+--------+  Popliteal      no                                                  +--------------+---------+------+-----------+------------+--------+  GSV at SFJ     no                               .52                +--------------+---------+------+-----------+------------+--------+  GSV prox thigh no                               .47                +--------------+---------+------+-----------+------------+--------+  GSV  mid thigh  no                               .34                +--------------+---------+------+-----------+------------+--------+  GSV dist thigh no                               .32                +--------------+---------+------+-----------+------------+--------+  GSV at knee    no                               .34                +--------------+---------+------+-----------+------------+--------+  GSV prox calf  no                               .30                +--------------+---------+------+-----------+------------+--------+  SSV Pop Fossa  no                               .22                +--------------+---------+------+-----------+------------+--------+  +--------------+---------+------+-----------+------------+--------+  LEFT           Reflux No Reflux Reflux Time Diameter cms Comments                             Yes                                      +--------------+---------+------+-----------+------------+--------+  CFV            no                                                  +--------------+---------+------+-----------+------------+--------+  FV prox        no                                                  +--------------+---------+------+-----------+------------+--------+  FV mid         no                                                  +--------------+---------+------+-----------+------------+--------+  FV dist        no                                                  +--------------+---------+------+-----------+------------+--------+  Popliteal      no                               .51                +--------------+---------+------+-----------+------------+--------+  GSV at SFJ     no                               .31                +--------------+---------+------+-----------+------------+--------+  GSV prox thigh no                               .33                +--------------+---------+------+-----------+------------+--------+  GSV mid thigh  no                                .37                +--------------+---------+------+-----------+------------+--------+  GSV dist thigh no                               .37                +--------------+---------+------+-----------+------------+--------+  GSV at knee    no                               .25                +--------------+---------+------+-----------+------------+--------+  GSV prox calf  no                               .17                +--------------+---------+------+-----------+------------+--------+  SSV Pop Fossa  no                                                  +--------------+---------+------+-----------+------------+--------+   Summary: Bilateral: - No evidence of deep vein thrombosis seen in the lower extremities, bilaterally, from the common femoral through the popliteal veins. - No evidence of superficial venous thrombosis in the lower extremities, bilaterally. - No evidence of deep venous insufficiency seen bilaterally in the lower extremity. - No evidence of superficial venous reflux seen in the greater saphenous veins bilaterally. - No evidence of superficial venous reflux seen in the short saphenous veins bilaterally.  Right: - There appears to be a Baker's Cyst seen behind the Right Knee area measuring 1.64 cms x .87 cms.  Left: - There appears to be a Baker's Cyst seen behind the Left Knee area measuring 1.11 cms x 1.37cms.  *See table(s) above for measurements and observations. Electronically signed by Hortencia Pilar MD on 01/26/2021 at 2:46:23 PM.    Final       ASSESSMENT & PLAN:  1. Elevated IgE level    Labs are reviewed and discussed with patient.  He is asymptomatic, without constitutional symptoms, skin complaints, respiratory complaints. Physical examination revealed no lymphadenopathy.  Discussed that differential diagnosis for high IgE is wide, including infectious disease, allergic reactions, immunodeficiencies, inflammation, lymphoma, tobacco use, drug side effects. He is currently  someday smoker which may contribute.  Check EBV, CMV, Quantiferon TB, multiple myeloma panel, light chain ratio, flow cytometry, ESR, CRP,  If work up is negative, will refer him to immunology for further evaluation.   Orders Placed This Encounter  Procedures   CMV IgM    Standing Status:   Future    Number of Occurrences:   1    Standing Expiration Date:   02/16/2022   EBV ab to viral capsid ag pnl, IgG+IgM    Standing Status:   Future    Number of Occurrences:   1    Standing Expiration Date:   02/16/2022   Multiple Myeloma Panel (SPEP&IFE w/QIG)    Standing Status:   Future    Number of Occurrences:   1    Standing Expiration Date:   02/16/2022   Flow cytometry panel-leukemia/lymphoma work-up    Standing Status:   Future    Number of Occurrences:   1    Standing Expiration Date:   02/16/2022   Kappa/lambda light chains    Standing Status:   Future    Number of Occurrences:   1    Standing Expiration Date:   02/16/2022   Sedimentation rate    Standing Status:   Future    Number of Occurrences:   1    Standing Expiration Date:   02/16/2022   C-reactive protein    Standing Status:   Future    Number of Occurrences:   1    Standing Expiration Date:   02/16/2022   Comprehensive metabolic panel    Standing Status:   Future    Number of Occurrences:   1    Standing Expiration Date:   02/16/2022   CBC with Differential/Platelet    Standing Status:   Future    Number of Occurrences:   1    Standing Expiration Date:   02/16/2022   QuantiFERON-TB Gold Plus    Standing Status:   Future    Number of Occurrences:   1    Standing Expiration Date:   02/16/2022    All questions were answered. The patient knows to call the clinic with any problems questions or concerns.   [name], MD    Return of visit:  Thank you for this kind referral and the opportunity to participate in the care of this patient. A copy of today's note is routed to referring provider   Earlie Server, MD, PhD [facility]  Hematology Oncology 02/18/2021

## 2021-02-20 LAB — COMP PANEL: LEUKEMIA/LYMPHOMA

## 2021-02-20 LAB — QUANTIFERON-TB GOLD PLUS (RQFGPL)
QuantiFERON Mitogen Value: >10 IU/mL
QuantiFERON Nil Value: 0.05 IU/mL
QuantiFERON TB1 Ag Value: 0.05 IU/mL
QuantiFERON TB2 Ag Value: 0.04 IU/mL

## 2021-02-20 LAB — QUANTIFERON-TB GOLD PLUS: QuantiFERON-TB Gold Plus: NEGATIVE

## 2021-02-22 LAB — MULTIPLE MYELOMA PANEL, SERUM
Albumin SerPl Elph-Mcnc: 3.5 g/dL (ref 2.9–4.4)
Albumin/Glob SerPl: 1 (ref 0.7–1.7)
Alpha 1: 0.2 g/dL (ref 0.0–0.4)
Alpha2 Glob SerPl Elph-Mcnc: 0.5 g/dL (ref 0.4–1.0)
B-Globulin SerPl Elph-Mcnc: 1 g/dL (ref 0.7–1.3)
Gamma Glob SerPl Elph-Mcnc: 1.9 g/dL — ABNORMAL HIGH (ref 0.4–1.8)
Globulin, Total: 3.7 g/dL (ref 2.2–3.9)
IgA: 457 mg/dL — ABNORMAL HIGH (ref 61–437)
IgG (Immunoglobin G), Serum: 1751 mg/dL — ABNORMAL HIGH (ref 603–1613)
IgM (Immunoglobulin M), Srm: 186 mg/dL — ABNORMAL HIGH (ref 20–172)
M Protein SerPl Elph-Mcnc: 0.7 g/dL — ABNORMAL HIGH
Total Protein ELP: 7.2 g/dL (ref 6.0–8.5)

## 2021-02-23 ENCOUNTER — Encounter: Payer: Self-pay | Admitting: Emergency Medicine

## 2021-02-23 ENCOUNTER — Emergency Department
Admission: EM | Admit: 2021-02-23 | Discharge: 2021-02-23 | Disposition: A | Discharge status: Home / Self Care | Payer: Worker's Compensation | Attending: Emergency Medicine | Admitting: Emergency Medicine

## 2021-02-23 ENCOUNTER — Other Ambulatory Visit: Payer: Self-pay

## 2021-02-23 DIAGNOSIS — Z2914 Encounter for prophylactic rabies immune globin: Secondary | ICD-10-CM | POA: Insufficient documentation

## 2021-02-23 DIAGNOSIS — W540XXA Bitten by dog, initial encounter: Secondary | ICD-10-CM | POA: Insufficient documentation

## 2021-02-23 DIAGNOSIS — Z23 Encounter for immunization: Secondary | ICD-10-CM | POA: Insufficient documentation

## 2021-02-23 DIAGNOSIS — S31159A Open bite of abdominal wall, unspecified quadrant without penetration into peritoneal cavity, initial encounter: Secondary | ICD-10-CM | POA: Insufficient documentation

## 2021-02-23 DIAGNOSIS — Z203 Contact with and (suspected) exposure to rabies: Secondary | ICD-10-CM | POA: Diagnosis present

## 2021-02-23 DIAGNOSIS — Y990 Civilian activity done for income or pay: Secondary | ICD-10-CM | POA: Diagnosis not present

## 2021-02-23 MED ORDER — AMOXICILLIN-POT CLAVULANATE 875-125 MG PO TABS: 1.0000 | ORAL_TABLET | Freq: Two times a day (BID) | ORAL | 0 refills | Status: AC

## 2021-02-23 MED ORDER — RABIES VACCINE, PCEC IM SUSR
1.0000 mL | Freq: Once | INTRAMUSCULAR | Status: AC
  Administered 2021-02-23: 1 mL via INTRAMUSCULAR
  Filled 2021-02-23: qty 1

## 2021-02-23 MED ORDER — RABIES IMMUNE GLOBULIN 150 UNIT/ML IM INJ
20.0000 [IU]/kg | INJECTION | Freq: Once | INTRAMUSCULAR | Status: AC
  Administered 2021-02-23: 1350 [IU] via INTRAMUSCULAR
  Filled 2021-02-23: qty 9

## 2021-02-23 NOTE — Discharge Instructions (Signed)
You will need to go to urgent care in 3 days, 7 days, and 21 days for injections.

## 2021-02-23 NOTE — ED Notes (Signed)
See triage note  presents s/p dog bite  states he was bitten by a dog on Sunday  he has small puncture wound area to abd

## 2021-02-23 NOTE — ED Provider Notes (Signed)
[facility] Provider Note    Event Date/Time   First MD Initiated Contact with Patient 02/23/21 1024     (approximate)   History   Animal Bite   HPI  Paul Willis is a 66 y.o. male with history of MI, alcoholic cirrhosis and as listed in EMR presents to the emergency department for rabies series after being bitten by a dog 4 days ago while at work in [place]. Unsure if he can get in contact with the owner of the dog and is unsure if it was current on vaccinations.       Physical Exam   Triage Vital Signs: ED Triage Vitals  Enc Vitals Group     BP 02/23/21 1013 130/73     Pulse Rate 02/23/21 1013 91     Resp 02/23/21 1013 20     Temp 02/23/21 1013 (!) 97.5 F (36.4 C)     Temp Source 02/23/21 1013 Oral     SpO2 02/23/21 1013 95 %     Weight 02/23/21 1009 147 lb (66.7 kg)     Height 02/23/21 1009 5\' 9"  (1.753 m)     Head Circumference --      Peak Flow --      Pain Score --      Pain Loc --      Pain Edu? --      Excl. in [place]? --     Most recent vital signs: Vitals:   02/23/21 1013  BP: 130/73  Pulse: 91  Resp: 20  Temp: (!) 97.5 F (36.4 C)  SpO2: 95%    General: Awake, no distress.  CV:  Good peripheral perfusion.  Resp:  Normal effort.  Abd:  No distention.  Other:  Scabbed puncture wound to left lateral abdomen.   ED Results / Procedures / Treatments   Labs (all labs ordered are listed, but only abnormal results are displayed) Labs Reviewed - No data to display   EKG     RADIOLOGY  Image and radiology report reviewed by me.   PROCEDURES:  Critical Care performed: No  Procedures   MEDICATIONS ORDERED IN ED: Medications  rabies vaccine (RABAVERT) injection 1 mL (1 mL Intramuscular Given 02/23/21 1101)  rabies immune globulin (HYPERAB/KEDRAB) injection 1,350 Units (1,350 Units Intramuscular Given 02/23/21 1101)     IMPRESSION / MDM / ASSESSMENT AND PLAN / ED COURSE   I have reviewed the triage  note.  Differential diagnosis includes, but is not limited to, need for rabies series  66 year old male presents 4 days after being bitten by a dog. See HPI.  Rabies series initiated. He is to go to urgent care for additional injections. Augmentin prescribed. He is to return to the ER for symptoms of concern.  Animal control was not contacted. Patient does not know address or name of dog owner.      FINAL CLINICAL IMPRESSION(S) / ED DIAGNOSES   Final diagnoses:  Dog bite, initial encounter  Need for post exposure prophylaxis for rabies     Rx / DC Orders   ED Discharge Orders          Ordered    amoxicillin-clavulanate (AUGMENTIN) 875-125 MG tablet  2 times daily        02/23/21 1109             Note:  This document was prepared using Dragon voice recognition software and may include unintentional dictation errors.   Rhyse Skowron B,  FNP 02/23/21 1220    Blake Divine, MD 02/24/21 [phone]

## 2021-02-23 NOTE — ED Triage Notes (Signed)
Pt via POV from home. Pt was bitten by a dog on his abdomen on Sunday. States that he does not know if the dog was vaccinated and he did not call animal control. Pt is A&OX4 and NAD.   Pt is worker's compensation case.

## 2021-02-27 ENCOUNTER — Encounter: Payer: Self-pay | Admitting: *Deleted

## 2021-02-27 ENCOUNTER — Other Ambulatory Visit: Payer: Self-pay

## 2021-02-27 ENCOUNTER — Ambulatory Visit
Admission: EM | Admit: 2021-02-27 | Discharge: 2021-02-27 | Disposition: A | Discharge status: Home / Self Care | Payer: BC Managed Care – PPO | Attending: Internal Medicine | Admitting: Internal Medicine

## 2021-02-27 DIAGNOSIS — Z203 Contact with and (suspected) exposure to rabies: Secondary | ICD-10-CM

## 2021-02-27 MED ORDER — RABIES VACCINE, PCEC IM SUSR
1.0000 mL | Freq: Once | INTRAMUSCULAR | Status: AC
  Administered 2021-02-27: 1 mL via INTRAMUSCULAR

## 2021-02-27 NOTE — ED Triage Notes (Signed)
Pt presents for 2nd rabies shot   

## 2021-03-01 ENCOUNTER — Telehealth: Payer: Self-pay

## 2021-03-01 NOTE — Telephone Encounter (Signed)
Paul Willis, please schedule patient MD follow up next available. Please notify patient of appointments. Thanks

## 2021-03-01 NOTE — Telephone Encounter (Signed)
-----   Message from Earlie Server, MD sent at 02/28/2021 10:59 PM EST ----- Please schedule him to do a follow up appt with me. MD only, non urgent

## 2021-04-03 ENCOUNTER — Ambulatory Visit
Admission: RE | Admit: 2021-04-03 | Discharge: 2021-04-03 | Disposition: A | Discharge status: Home / Self Care | Payer: BC Managed Care – PPO | Source: Ambulatory Visit | Attending: Family Medicine | Admitting: Family Medicine

## 2021-04-03 ENCOUNTER — Ambulatory Visit
Admission: RE | Admit: 2021-04-03 | Discharge: 2021-04-03 | Disposition: A | Discharge status: Home / Self Care | Payer: BC Managed Care – PPO | Source: Ambulatory Visit | Attending: Gastroenterology | Admitting: Gastroenterology

## 2021-04-03 ENCOUNTER — Other Ambulatory Visit: Payer: Self-pay

## 2021-04-03 DIAGNOSIS — K7031 Alcoholic cirrhosis of liver with ascites: Secondary | ICD-10-CM | POA: Diagnosis present

## 2021-04-03 DIAGNOSIS — G621 Alcoholic polyneuropathy: Secondary | ICD-10-CM | POA: Insufficient documentation

## 2021-04-04 ENCOUNTER — Inpatient Hospital Stay: Payer: BC Managed Care – PPO | Attending: Oncology | Admitting: Oncology

## 2021-05-05 DIAGNOSIS — D696 Thrombocytopenia, unspecified: Secondary | ICD-10-CM | POA: Insufficient documentation

## 2021-05-05 DIAGNOSIS — F17200 Nicotine dependence, unspecified, uncomplicated: Secondary | ICD-10-CM | POA: Insufficient documentation

## 2021-05-05 DIAGNOSIS — R7401 Elevation of levels of liver transaminase levels: Secondary | ICD-10-CM | POA: Insufficient documentation

## 2021-07-27 ENCOUNTER — Ambulatory Visit (INDEPENDENT_AMBULATORY_CARE_PROVIDER_SITE_OTHER): Payer: BC Managed Care – PPO | Admitting: Nurse Practitioner

## 2021-08-03 ENCOUNTER — Ambulatory Visit: Payer: BC Managed Care – PPO | Admitting: Gastroenterology

## 2021-08-03 ENCOUNTER — Other Ambulatory Visit: Payer: Self-pay

## 2021-08-03 NOTE — Progress Notes (Deleted)
Wyline Mood MD, MRCP(U.K) [address]  [address]  [place], [place] [address]  Main: [phone]  Fax: [phone]   Primary Care Physician: Marisue Ivan, MD  Primary Gastroenterologist:  Dr. Wyline Mood   No chief complaint on file.   HPI: Paul Willis is a 66 y.o. male Summary of history :   Initially referred and seen on 12/15/2020 for cirrhosis of the liver.  Admitted in 10/04/2020 with shortness of breath.  Found to have cirrhosis of the liver with ascites, gynecomastia it was found on imaging.  History of excess alcohol consumption for many years but he quit in September 2022 no other high risk behaviors. 01/19/2021: EGD: Moderate portal hypertensive gastropathy noted no esophageal varices 12/16/2020: Ultrasound ascites shows no intra-abdominal ascites   12/15/2020: INR 0.9 not immune to hepatitis a and B request vaccination negative hepatitis C.  Autoimmune work-up was negative IgE levels were 8200.  Serology was negative.    12/2020 : MELD 6     Interval history   02/02/2021-08/03/2021  02/16/2021: Elevated IGE : seen hematology  04/03/2021: RUQ USG:no focal lesion   No confusion , no constipation. Denies any NSAID use. Not drinking any alcohol. Only issue is leg pains for which he has started gabapentin.    Current Outpatient Medications  Medication Sig Dispense Refill   folic acid (FOLVITE) 1 MG tablet Take 1 tablet (1 mg total) by mouth daily. 30 tablet 2   gabapentin (NEURONTIN) 300 MG capsule Take 300 mg by mouth at bedtime.     lactulose (CHRONULAC) 10 GM/15ML solution Take 15 mLs (10 g total) by mouth 2 (two) times daily. 600 mL 11   Magnesium (RA NATURAL MAGNESIUM) 250 MG TABS Take 1 tablet by mouth daily.     polyethylene glycol (MIRALAX) 17 g packet Take 17 g by mouth daily. 14 each 0   potassium gluconate (RA POTASSIUM GLUCONATE) 595 (99 K) MG TABS tablet Take 1 tablet by mouth daily.     thiamine 100 MG tablet Take 1 tablet (100 mg total) by mouth  daily. 30 tablet 2   vitamin B-12 (CYANOCOBALAMIN) 1000 MCG tablet Take 1 tablet by mouth daily.     Current Facility-Administered Medications  Medication Dose Route Frequency Provider Last Rate Last Admin   albumin human 25 % solution 25 g  25 g Intravenous Once Wyline Mood, MD       albumin human 25 % solution 25 g  25 g Intravenous Once Wyline Mood, MD        Allergies as of 08/03/2021   (No Known Allergies)    ROS:  General: Negative for anorexia, weight loss, fever, chills, fatigue, weakness. ENT: Negative for hoarseness, difficulty swallowing , nasal congestion. CV: Negative for chest pain, angina, palpitations, dyspnea on exertion, peripheral edema.  Respiratory: Negative for dyspnea at rest, dyspnea on exertion, cough, sputum, wheezing.  GI: See history of present illness. GU:  Negative for dysuria, hematuria, urinary incontinence, urinary frequency, nocturnal urination.  Endo: Negative for unusual weight change.    Physical Examination:   There were no vitals taken for this visit.  General: Well-nourished, well-developed in no acute distress.  Eyes: No icterus. Conjunctivae pink. Mouth: Oropharyngeal mucosa moist and pink , no lesions erythema or exudate. Lungs: Clear to auscultation bilaterally. Non-labored. Heart: Regular rate and rhythm, no murmurs rubs or gallops.  Abdomen: Bowel sounds are normal, nontender, nondistended, no hepatosplenomegaly or masses, no abdominal bruits or hernia , no rebound or guarding.  Extremities: No lower extremity edema. No clubbing or deformities. Neuro: Alert and oriented x 3.  Grossly intact. Skin: Warm and dry, no jaundice.   Psych: Alert and cooperative, normal mood and affect.   Imaging Studies: No results found.  Assessment and Plan:   Paul Willis is a 66 y.o. y/o male here to follow-up for alcoholic liver cirrhosis with ascites initially which has resolved after stopping alcohol, gynecomastia incidentally found on imaging.   .  Presently no hepatic encephalopathy.  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 12/2020- MELD score 6      Plan 1.  Low-salt diet 2.  Stop all alcohol suggested attend  alcoholic Anonymous 3.  EGD to screen for esophageal varices in January 2025 4.  I9/ 2023.  Right upper quadrant ultrasound to screen for HCC 5.  Full autoimmune and viral hepatitis work-up 6.  Needs hep A/ B vaccine, Pneumococcal vaccine 7.   No ascites seen on recent imaging and if it were to develop in the future would avoid spironolactone as he has developed gynecomastia and will need to use eplerenone      Dr Wyline Mood  MD,MRCP [facility]) Follow up in ***

## 2021-11-13 ENCOUNTER — Encounter (INDEPENDENT_AMBULATORY_CARE_PROVIDER_SITE_OTHER): Payer: Self-pay

## 2021-11-17 ENCOUNTER — Inpatient Hospital Stay: Payer: Medicaid Other

## 2021-11-17 ENCOUNTER — Encounter: Payer: Self-pay | Admitting: Emergency Medicine

## 2021-11-17 ENCOUNTER — Other Ambulatory Visit: Payer: Self-pay

## 2021-11-17 ENCOUNTER — Inpatient Hospital Stay
Admission: EM | Admit: 2021-11-17 | Discharge: 2021-11-21 | DRG: 434 | Disposition: A | Discharge status: Home / Self Care | Payer: Medicaid Other | Attending: Internal Medicine | Admitting: Internal Medicine

## 2021-11-17 DIAGNOSIS — K7031 Alcoholic cirrhosis of liver with ascites: Principal | ICD-10-CM | POA: Diagnosis present

## 2021-11-17 DIAGNOSIS — E876 Hypokalemia: Secondary | ICD-10-CM | POA: Diagnosis present

## 2021-11-17 DIAGNOSIS — G621 Alcoholic polyneuropathy: Secondary | ICD-10-CM | POA: Diagnosis present

## 2021-11-17 DIAGNOSIS — Z79899 Other long term (current) drug therapy: Secondary | ICD-10-CM | POA: Diagnosis not present

## 2021-11-17 DIAGNOSIS — J45909 Unspecified asthma, uncomplicated: Secondary | ICD-10-CM | POA: Diagnosis present

## 2021-11-17 DIAGNOSIS — F17210 Nicotine dependence, cigarettes, uncomplicated: Secondary | ICD-10-CM | POA: Diagnosis present

## 2021-11-17 DIAGNOSIS — D6959 Other secondary thrombocytopenia: Secondary | ICD-10-CM | POA: Diagnosis present

## 2021-11-17 DIAGNOSIS — F1020 Alcohol dependence, uncomplicated: Secondary | ICD-10-CM | POA: Diagnosis present

## 2021-11-17 DIAGNOSIS — Z888 Allergy status to other drugs, medicaments and biological substances status: Secondary | ICD-10-CM

## 2021-11-17 DIAGNOSIS — I2510 Atherosclerotic heart disease of native coronary artery without angina pectoris: Secondary | ICD-10-CM | POA: Diagnosis present

## 2021-11-17 DIAGNOSIS — R768 Other specified abnormal immunological findings in serum: Secondary | ICD-10-CM | POA: Diagnosis present

## 2021-11-17 DIAGNOSIS — R7689 Other specified abnormal immunological findings in serum: Secondary | ICD-10-CM | POA: Diagnosis present

## 2021-11-17 DIAGNOSIS — E8770 Fluid overload, unspecified: Secondary | ICD-10-CM | POA: Diagnosis present

## 2021-11-17 DIAGNOSIS — F17200 Nicotine dependence, unspecified, uncomplicated: Secondary | ICD-10-CM | POA: Diagnosis present

## 2021-11-17 DIAGNOSIS — R7401 Elevation of levels of liver transaminase levels: Secondary | ICD-10-CM | POA: Diagnosis present

## 2021-11-17 DIAGNOSIS — R339 Retention of urine, unspecified: Secondary | ICD-10-CM | POA: Diagnosis present

## 2021-11-17 DIAGNOSIS — D696 Thrombocytopenia, unspecified: Secondary | ICD-10-CM | POA: Diagnosis present

## 2021-11-17 DIAGNOSIS — R188 Other ascites: Secondary | ICD-10-CM | POA: Diagnosis present

## 2021-11-17 LAB — CBC
HCT: 42.6 % (ref 39.0–52.0)
Hemoglobin: 14.6 g/dL (ref 13.0–17.0)
MCH: 32.1 pg (ref 26.0–34.0)
MCHC: 34.3 g/dL (ref 30.0–36.0)
MCV: 93.6 fL (ref 80.0–100.0)
Platelets: 136 10*3/uL — ABNORMAL LOW (ref 150–400)
RBC: 4.55 MIL/uL (ref 4.22–5.81)
RDW: 12.6 % (ref 11.5–15.5)
WBC: 6.9 10*3/uL (ref 4.0–10.5)
nRBC: 0 % (ref 0.0–0.2)

## 2021-11-17 LAB — COMPREHENSIVE METABOLIC PANEL
ALT: 18 U/L (ref 0–44)
AST: 45 U/L — ABNORMAL HIGH (ref 15–41)
Albumin: 2.2 g/dL — ABNORMAL LOW (ref 3.5–5.0)
Alkaline Phosphatase: 96 U/L (ref 38–126)
Anion gap: 10 (ref 5–15)
BUN: 9 mg/dL (ref 8–23)
CO2: 38 mmol/L — ABNORMAL HIGH (ref 22–32)
Calcium: 7.6 mg/dL — ABNORMAL LOW (ref 8.9–10.3)
Chloride: 82 mmol/L — ABNORMAL LOW (ref 98–111)
Creatinine, Ser: 0.9 mg/dL (ref 0.61–1.24)
GFR, Estimated: >60 mL/min (ref >60)
Glucose, Bld: 102 mg/dL — ABNORMAL HIGH (ref 70–99)
Potassium: 2.7 mmol/L — CL (ref 3.5–5.1)
Sodium: 130 mmol/L — ABNORMAL LOW (ref 135–145)
Total Bilirubin: 1.8 mg/dL — ABNORMAL HIGH (ref 0.3–1.2)
Total Protein: 6.1 g/dL — ABNORMAL LOW (ref 6.5–8.1)

## 2021-11-17 LAB — MAGNESIUM: Magnesium: 1.8 mg/dL (ref 1.7–2.4)

## 2021-11-17 LAB — AMMONIA: Ammonia: 20 umol/L (ref 9–35)

## 2021-11-17 LAB — LIPASE, BLOOD: Lipase: 46 U/L (ref 11–51)

## 2021-11-17 LAB — PHOSPHORUS: Phosphorus: 2.4 mg/dL — ABNORMAL LOW (ref 2.5–4.6)

## 2021-11-17 MED ORDER — POTASSIUM CHLORIDE 10 MEQ/100ML IV SOLN: 10.0000 meq | INTRAVENOUS | Status: DC

## 2021-11-17 MED ORDER — LORAZEPAM 2 MG/ML IJ SOLN: 2.0000 mg | INTRAMUSCULAR | Status: DC | PRN

## 2021-11-17 MED ORDER — LIDOCAINE 5 % EX PTCH
1.0000 | MEDICATED_PATCH | Freq: Every day | CUTANEOUS | Status: DC | PRN
  Administered 2021-11-19: 1 via TRANSDERMAL
  Filled 2021-11-17: qty 1

## 2021-11-17 MED ORDER — ALUM & MAG HYDROXIDE-SIMETH 200-200-20 MG/5ML PO SUSP: 30.0000 mL | Freq: Four times a day (QID) | ORAL | Status: DC | PRN

## 2021-11-17 MED ORDER — POTASSIUM CHLORIDE 10 MEQ/100ML IV SOLN
10.0000 meq | Freq: Once | INTRAVENOUS | Status: AC
Start: 2021-11-17 — End: 2021-11-17
  Administered 2021-11-17: 10 meq via INTRAVENOUS
  Filled 2021-11-17: qty 100

## 2021-11-17 MED ORDER — POTASSIUM CHLORIDE 10 MEQ/100ML IV SOLN
10.0000 meq | INTRAVENOUS | Status: AC
  Administered 2021-11-17 (×4): 10 meq via INTRAVENOUS
  Filled 2021-11-17 (×3): qty 100

## 2021-11-17 MED ORDER — NICOTINE 21 MG/24HR TD PT24: 21.0000 mg | MEDICATED_PATCH | Freq: Every day | TRANSDERMAL | Status: DC | PRN

## 2021-11-17 MED ORDER — THIAMINE HCL 100 MG/ML IJ SOLN: 100.0000 mg | Freq: Every day | INTRAMUSCULAR | Status: DC

## 2021-11-17 MED ORDER — FOLIC ACID 1 MG PO TABS
1.0000 mg | ORAL_TABLET | Freq: Every day | ORAL | Status: DC
  Administered 2021-11-17 – 2021-11-21 (×5): 1 mg via ORAL
  Filled 2021-11-17 (×5): qty 1

## 2021-11-17 MED ORDER — VITAMIN B-12 1000 MCG PO TABS
1000.0000 ug | ORAL_TABLET | Freq: Every day | ORAL | Status: DC
  Administered 2021-11-17 – 2021-11-21 (×5): 1000 ug via ORAL
  Filled 2021-11-17 (×5): qty 1

## 2021-11-17 MED ORDER — ACETAMINOPHEN 650 MG RE SUPP: 650.0000 mg | Freq: Four times a day (QID) | RECTAL | Status: DC | PRN

## 2021-11-17 MED ORDER — ACETAMINOPHEN 325 MG PO TABS: 650.0000 mg | ORAL_TABLET | Freq: Four times a day (QID) | ORAL | Status: DC | PRN

## 2021-11-17 MED ORDER — SENNOSIDES-DOCUSATE SODIUM 8.6-50 MG PO TABS: 1.0000 | ORAL_TABLET | Freq: Every evening | ORAL | Status: DC | PRN

## 2021-11-17 MED ORDER — POTASSIUM CHLORIDE 10 MEQ/100ML IV SOLN
10.0000 meq | Freq: Once | INTRAVENOUS | Status: AC
  Administered 2021-11-17: 10 meq via INTRAVENOUS
  Filled 2021-11-17: qty 100

## 2021-11-17 MED ORDER — ONDANSETRON HCL 4 MG PO TABS: 4.0000 mg | ORAL_TABLET | Freq: Four times a day (QID) | ORAL | Status: DC | PRN

## 2021-11-17 MED ORDER — LIDOCAINE 5 % EX PTCH: 1.0000 | MEDICATED_PATCH | CUTANEOUS | Status: DC

## 2021-11-17 MED ORDER — HEPARIN SODIUM (PORCINE) 5000 UNIT/ML IJ SOLN
5000.0000 [IU] | Freq: Three times a day (TID) | INTRAMUSCULAR | Status: DC
  Administered 2021-11-18 – 2021-11-21 (×10): 5000 [IU] via SUBCUTANEOUS
  Filled 2021-11-17 (×10): qty 1

## 2021-11-17 MED ORDER — MORPHINE SULFATE (PF) 2 MG/ML IV SOLN
0.5000 mg | INTRAVENOUS | Status: DC | PRN
  Administered 2021-11-19: 0.5 mg via INTRAVENOUS
  Filled 2021-11-17: qty 1

## 2021-11-17 MED ORDER — PANTOPRAZOLE SODIUM 40 MG PO TBEC
40.0000 mg | DELAYED_RELEASE_TABLET | Freq: Every day | ORAL | Status: AC | PRN
  Administered 2021-11-17: 40 mg via ORAL
  Filled 2021-11-17: qty 1

## 2021-11-17 MED ORDER — LORAZEPAM 2 MG/ML IJ SOLN: 1.0000 mg | INTRAMUSCULAR | Status: DC | PRN

## 2021-11-17 MED ORDER — ONDANSETRON HCL 4 MG/2ML IJ SOLN: 4.0000 mg | Freq: Four times a day (QID) | INTRAMUSCULAR | Status: DC | PRN

## 2021-11-17 MED ORDER — GABAPENTIN 300 MG PO CAPS
300.0000 mg | ORAL_CAPSULE | Freq: Every day | ORAL | Status: DC
  Administered 2021-11-17 – 2021-11-18 (×2): 300 mg via ORAL
  Filled 2021-11-17 (×2): qty 1

## 2021-11-17 MED ORDER — LORAZEPAM 1 MG PO TABS: 1.0000 mg | ORAL_TABLET | ORAL | Status: DC | PRN

## 2021-11-17 MED ORDER — THIAMINE MONONITRATE 100 MG PO TABS
100.0000 mg | ORAL_TABLET | Freq: Every day | ORAL | Status: DC
  Administered 2021-11-17 – 2021-11-21 (×5): 100 mg via ORAL
  Filled 2021-11-17 (×5): qty 1

## 2021-11-17 MED ORDER — ADULT MULTIVITAMIN W/MINERALS CH
1.0000 | ORAL_TABLET | Freq: Every day | ORAL | Status: DC
  Administered 2021-11-17 – 2021-11-21 (×5): 1 via ORAL
  Filled 2021-11-17 (×5): qty 1

## 2021-11-17 MED ORDER — POTASSIUM & SODIUM PHOSPHATES 280-160-250 MG PO PACK
1.0000 | PACK | Freq: Once | ORAL | Status: AC
  Administered 2021-11-17: 1 via ORAL
  Filled 2021-11-17 (×2): qty 1

## 2021-11-17 NOTE — ED Notes (Signed)
Pt. Placed on cont. Cardiac monitoring.  

## 2021-11-17 NOTE — Assessment & Plan Note (Signed)
-   Presumed secondary to alcohol abuse and dependence - Status post potassium chloride 10 mill equivalent IV, 2 doses ordered by EDP - Labs ordered potassium chloride 10 mEq IV every hour, 4 doses ordered - Phos-Nak once ordered

## 2021-11-17 NOTE — ED Provider Notes (Signed)
[facility] Provider Note    Event Date/Time   First MD Initiated Contact with Patient 11/17/21 1136     (approximate)   History   Abdominal Pain   HPI  Georgio Hattabaugh is a 66 y.o. male with history of alcoholic cirrhosis who presents with increased abdominal distention over the last 2 weeks, gradual onset, associated with minimal lower abdominal pain.  He reports some shortness of breath as well.  He has no fever or chills.  He has never had swelling accumulate like this before.  I reviewed the past medical records.  The patient was most recently evaluated by Dr. Tasia Catchings from hematology/oncology on 02/16/2021 for elevated IgE level.  He had a telephone encounter with Dr. Netty Starring from internal medicine on 4/28 but has not had any outpatient visits since then.  Previous imaging from early last year did not show evidence of ascites.   Physical Exam   Triage Vital Signs: ED Triage Vitals  Enc Vitals Group     BP 11/17/21 1043 138/82     Pulse Rate 11/17/21 1043 (!) 112     Resp 11/17/21 1043 18     Temp 11/17/21 1043 (!) 97.5 F (36.4 C)     Temp Source 11/17/21 1043 Oral     SpO2 11/17/21 1043 98 %     Weight 11/17/21 1043 169 lb (76.7 kg)     Height 11/17/21 1043 5\' 9"  (1.753 m)     Head Circumference --      Peak Flow --      Pain Score 11/17/21 1051 0     Pain Loc --      Pain Edu? --      Excl. in [place]? --     Most recent vital signs: Vitals:   11/17/21 1216 11/17/21 1230  BP: 120/83 128/79  Pulse: (!) 106 (!) 112  Resp: 14 14  Temp: 98.6 F (37 C)   SpO2: 98% 98%     General: Awake, no distress.  CV:  Good peripheral perfusion.  Resp:  Normal effort.  Abd:  Moderate distention.  Other:  No scleral icterus.  No asterixis.   ED Results / Procedures / Treatments   Labs (all labs ordered are listed, but only abnormal results are displayed) Labs Reviewed  COMPREHENSIVE METABOLIC PANEL - Abnormal; Notable for the following components:       Result Value   Sodium 130 (*)    Potassium 2.7 (*)    Chloride 82 (*)    CO2 38 (*)    Glucose, Bld 102 (*)    Calcium 7.6 (*)    Total Protein 6.1 (*)    Albumin 2.2 (*)    AST 45 (*)    Total Bilirubin 1.8 (*)    All other components within normal limits  CBC - Abnormal; Notable for the following components:   Platelets 136 (*)    All other components within normal limits  PHOSPHORUS - Abnormal; Notable for the following components:   Phosphorus 2.4 (*)    All other components within normal limits  LIPASE, BLOOD  MAGNESIUM  URINALYSIS, ROUTINE W REFLEX MICROSCOPIC  AMMONIA     EKG  ED ECG REPORT I, Arta Silence, the attending physician, personally viewed and interpreted this ECG.  Date: 11/17/2021 EKG Time: 1051 Rate: 120 Rhythm: Sinus tachycardia QRS Axis: normal Intervals: normal ST/T Wave abnormalities: normal Narrative Interpretation: no evidence of acute ischemia    RADIOLOGY  PROCEDURES:  Critical Care performed: No  Procedures   MEDICATIONS ORDERED IN ED: Medications  acetaminophen (TYLENOL) tablet 650 mg (has no administration in time range)    Or  acetaminophen (TYLENOL) suppository 650 mg (has no administration in time range)  ondansetron (ZOFRAN) tablet 4 mg (has no administration in time range)    Or  ondansetron (ZOFRAN) injection 4 mg (has no administration in time range)  heparin injection 5,000 Units (has no administration in time range)  senna-docusate (Senokot-S) tablet 1 tablet (has no administration in time range)  potassium & sodium phosphates (PHOS-NAK) 280-160-250 MG packet 1 packet (has no administration in time range)  thiamine (VITAMIN B1) tablet 100 mg (has no administration in time range)  folic acid (FOLVITE) tablet 1 mg (has no administration in time range)  multivitamin with minerals tablet 1 tablet (has no administration in time range)  morphine (PF) 2 MG/ML injection 0.5 mg (has no administration in time  range)  lidocaine (LIDODERM) 5 % 1 patch (has no administration in time range)  potassium chloride 10 mEq in 100 mL IVPB (has no administration in time range)  nicotine (NICODERM CQ - dosed in mg/24 hours) patch 21 mg (has no administration in time range)  cyanocobalamin (VITAMIN B12) tablet 1,000 mcg (has no administration in time range)  gabapentin (NEURONTIN) capsule 300 mg (has no administration in time range)  LORazepam (ATIVAN) injection 2 mg (has no administration in time range)  potassium chloride 10 mEq in 100 mL IVPB (10 mEq Intravenous New Bag/Given 11/17/21 1308)  potassium chloride 10 mEq in 100 mL IVPB (10 mEq Intravenous New Bag/Given 11/17/21 1308)     IMPRESSION / MDM / ASSESSMENT AND PLAN / ED COURSE  I reviewed the triage vital signs and the nursing notes.  66 year old male with PMH as noted above including alcoholic cirrhosis presents with new onset of abdominal distention and some shortness of breath over the last 2 weeks.  He denies ever having ascites before or having his abdomen drained.  On exam he has significant distention of the abdomen consistent with ascites and I performed a bedside ultrasound which confirmed the presence of a large volume of fluid.  Differential diagnosis includes, but is not limited to, ascites, other fluid overload.  I do not suspect SBP as the patient has no significant abdominal pain, tenderness to palpation, or fever.  We will obtain lab work-up, order IR paracentesis.  The patient will need admission given that this is new onset, he likely will need albumin and monitoring of his blood pressure and hemodynamics after therapeutic paracentesis, as well as potential further work-up and consultation.  Patient's presentation is most consistent with acute presentation with potential threat to life or bodily function.   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  There is no leukocytosis.  Lipase is normal.  Bilirubin is minimally elevated.  LFTs are normal.  I consulted Dr. Tobie Poet from the hospitalist service; based on our discussion she agrees to admit the patient.   FINAL CLINICAL IMPRESSION(S) / ED DIAGNOSES   Final diagnoses:  Ascites due to alcoholic cirrhosis ([place])     Rx / DC Orders   ED Discharge Orders     None        Note:  This document was prepared using Dragon voice recognition software and may include unintentional dictation errors.    Arta Silence,  MD 11/17/21 1421

## 2021-11-17 NOTE — ED Triage Notes (Signed)
Pt to ED via POV stating that over the last 2 weeks he has had increased swelling in his abdomen. Pt states that he is also having shortness of breath and pain in his lower abdomen. Pt has hx/o cirrhosis.

## 2021-11-17 NOTE — Assessment & Plan Note (Signed)
-   Initially elevated, 7000-8000 in the last year - Continue outpatient follow-up with hematologist

## 2021-11-17 NOTE — Assessment & Plan Note (Addendum)
-   Currently in remission - Patient states that his last alcoholic beverage was over a year ago when they ask him to stop - He denies history of seizure or tremors or withdrawals - Ativan 2 mg IV every 2 hours as needed for seizure, 2 doses ordered with instruction for nursing staff to administer as appropriate and then let provider know

## 2021-11-17 NOTE — Assessment & Plan Note (Addendum)
-   Presumed secondary to liver cirrhosis - LFTs in the a.m. - Would recommend patient follow-up with outpatient GI provider/hepatologist

## 2021-11-17 NOTE — H&P (Signed)
History and Physical   Paul Willis TFT:[id number] DOB: [birth year] DOA: 11/17/2021  PCP: Marisue Ivan, MD  Outpatient Specialists: Dr. Cathie Hoops, hematology Patient coming from: Home  I have personally briefly reviewed patient's old medical records in [facility] EMR.  Chief Concern: Abdominal distention  HPI: [name] is a 66 year old male with history of abdominal ascites, alcohol abuse, elevated IgE level, tobacco use, chronic thrombocytopenia, elevated AST compared to ALT consistent with alcohol abuse, who presents emergency department for chief concerns of abdominal distention.  Initial vitals in the emergency department showed temperature 97.5, respiration rate of 18, heart rate of 112, blood pressure 138/82, SPO2 of 98% on room air.  Serum sodium is 130, potassium 2.7, chloride 82, bicarb 38, BUN of 9, serum creatinine of 0.90, GFR greater than 60, nonfasting blood glucose 102, WBC 6.9, hemoglobin 14.6, platelets of 136, AST elevated at 45, ALT is 18.  EDP ordered IR for paracentesis.  ED treatment: Potassium 10 mill equivalent IV to dose ordered.  He is able to tell me his name, age, current location.   He reports his belly has been enlarging for about two weeks. He reprots his last drink was 1 year ago. He reports he used to drink 4-5 glasses per night.   He endorses swelling of his lower extremities, however states that today it is better than most days.Marland Kitchen  He endorses weight gain.  Social history: He lives with susan. He smokes 4-6 cigarettes per day. He is a former etoh user and last drink was 1 year ago. He denies recreational drug use.   ROS: Constitutional: no weight change, no fever ENT/Mouth: no sore throat, no rhinorrhea Eyes: no eye pain, no vision changes Cardiovascular: no chest pain, no dyspnea,  no edema, no palpitations Respiratory: no cough, no sputum, no wheezing Gastrointestinal: no nausea, no vomiting, no diarrhea, no constipation Genitourinary: no  urinary incontinence, no dysuria, no hematuria Musculoskeletal: no arthralgias, no myalgias Skin: no skin lesions, no pruritus, Neuro: + weakness, no loss of consciousness, no syncope Psych: no anxiety, no depression, + decrease appetite Heme/Lymph: no bruising, no bleeding  ED Course: Discussed with emergency medicine provider, patient requiring hospitalization for chief concerns of new abdominal ascites.  Assessment/Plan  Principal Problem:   Abdominal ascites Active Problems:   Hypokalemia   Asthma   CAD (coronary artery disease)   Alcoholism (HCC)   Alcoholic cirrhosis of liver with ascites (HCC)   Elevated IgE level   Elevated AST (SGOT)   Thrombocytopenia (HCC)   Tobacco dependence   Assessment and Plan:  * Abdominal ascites - Presumed secondary to chronic liver cirrhosis however with patient's endorsements of orthopnea, tobacco use, CAD, I have ordered a complete echo - Strict I's and O's - IR has been consulted for paracentesis by EDP - Admit to telemetry medical, inpatient  Tobacco dependence - PRN nicotine patch ordered  Elevated AST (SGOT) - Presumed secondary to liver cirrhosis - LFTs in the a.m. - Would recommend patient follow-up with outpatient GI provider/hepatologist  Elevated IgE level - Initially elevated, 7000-8000 in the last year - Continue outpatient follow-up with hematologist  Alcoholism [facility]) - Currently in remission - Patient states that his last alcoholic beverage was over a year ago when they ask him to stop - He denies history of seizure or tremors or withdrawals - Ativan 2 mg IV every 2 hours as needed for seizure, 2 doses ordered with instruction for nursing staff to administer as appropriate and then let provider know  Hypokalemia - Presumed secondary to alcohol abuse and dependence - Status post potassium chloride 10 mill equivalent IV, 2 doses ordered by EDP - Labs ordered potassium chloride 10 mEq IV every hour, 4 doses  ordered - Phos-Nak once ordered  Chart reviewed.   DVT prophylaxis: 5000 units subcutaneous every 8 hours starting on 11/18/2021 Code Status: Full code Diet: N.p.o. except for sips with meds Family Communication: No Disposition Plan: Pending clinical course Consults called: IR Admission status: Telemetry medical, inpatient  Past Medical History:  Diagnosis Date   Acute MI (HCC)    Alcohol-induced polyneuropathy (HCC)    Alcoholic cirrhosis of liver with ascites (HCC)    Alcoholism (HCC)    Asthma    Elevated IgE level    Past Surgical History:  Procedure Laterality Date   CARDIAC SURGERY     ESOPHAGOGASTRODUODENOSCOPY (EGD) WITH PROPOFOL N/A 01/19/2021   Procedure: ESOPHAGOGASTRODUODENOSCOPY (EGD) WITH PROPOFOL;  Surgeon: Wyline Mood, MD;  Location: [facility] ENDOSCOPY;  Service: Gastroenterology;  Laterality: N/A;   HERNIA REPAIR Bilateral    age 71   Social History:  reports that he has been smoking cigarettes. He has been smoking an average of .5 packs per day. He has never been exposed to tobacco smoke. He has never used smokeless tobacco. He reports that he does not currently use alcohol. He reports that he does not use drugs.  No Known Allergies Family History  Problem Relation Age of Onset   Lung cancer Mother    Stroke Father    Cancer Maternal Uncle    Cancer Paternal Aunt    Family history: Family history reviewed and not pertinent  Prior to Admission medications   Medication Sig Start Date End Date Taking? Authorizing Provider  folic acid (FOLVITE) 1 MG tablet Take 1 tablet (1 mg total) by mouth daily. 10/12/20   Noralee Stain, DO  gabapentin (NEURONTIN) 300 MG capsule Take 300 mg by mouth at bedtime. 04/18/21   [provider]  lactulose (CHRONULAC) 10 GM/15ML solution Take 15 mLs (10 g total) by mouth 2 (two) times daily. 12/15/20   Wyline Mood, MD  Magnesium (RA NATURAL MAGNESIUM) 250 MG TABS Take 1 tablet by mouth daily.    [provider]   polyethylene glycol (MIRALAX) 17 g packet Take 17 g by mouth daily. 10/16/20   Dionne Bucy, MD  potassium gluconate (RA POTASSIUM GLUCONATE) 595 (99 K) MG TABS tablet Take 1 tablet by mouth daily.    [provider]  thiamine 100 MG tablet Take 1 tablet (100 mg total) by mouth daily. 10/12/20   Noralee Stain, DO  vitamin B-12 (CYANOCOBALAMIN) 1000 MCG tablet Take 1 tablet by mouth daily. 02/17/21   [provider]   Physical Exam: Vitals:   11/17/21 1215 11/17/21 1216 11/17/21 1230 11/17/21 1458  BP:  120/83 128/79 123/79  Pulse: (!) 108 (!) 106 (!) 112 (!) 102  Resp: 14 14 14 20   Temp:  98.6 F (37 C)  99 F (37.2 C)  TempSrc:  Oral  Oral  SpO2: 98% 98% 98% 99%  Weight:      Height:       Constitutional: appears age-appropriate, NAD, calm, comfortable Eyes: PERRL, lids and conjunctivae normal ENMT: Mucous membranes are moist. Posterior pharynx clear of any exudate or lesions. Age-appropriate dentition. Hearing appropriate/ Neck: normal, supple, no masses, no thyromegaly Respiratory: clear to auscultation bilaterally, no wheezing, no crackles. Normal respiratory effort. No accessory muscle use.  Cardiovascular: Regular rate and rhythm, no  murmurs / rubs / gallops. No extremity edema. 2+ pedal pulses. No carotid bruits.  Abdomen: Distended abdomen, no tenderness, no masses palpated, no hepatosplenomegaly. Bowel sounds positive.  Musculoskeletal: no clubbing / cyanosis. No joint deformity upper and lower extremities. Good ROM, no contractures, no atrophy. Normal muscle tone.  Skin: no rashes, lesions, ulcers. No induration Neurologic: Sensation intact. Strength 5/5 in all 4.  Psychiatric: Normal judgment and insight. Alert and oriented x 3. Normal mood.   EKG: independently reviewed, showing sinus tachycardia with rate of 120, QTc 489  Chest x-ray on Admission: I personally reviewed and I agree with radiologist reading as below.  DG Chest Port 1  View  Result Date: 11/17/2021 CLINICAL DATA:  Dyspnea on exertion. EXAM: PORTABLE CHEST 1 VIEW COMPARISON:  Chest radiographs head CT chest 10/09/2020 FINDINGS: There is again moderate elevation of the right hemidiaphragm. Cardiac silhouette and mediastinal contours within normal limits. Medial right lower lung curvilinear subsegmental atelectasis versus scarring, corresponding to similar curvilinear densities on 10/09/2020 CT but appearing increased from same day 10/09/2020 radiograph. The left lung is clear. No pleural effusion or pneumothorax. No acute skeletal abnormality. IMPRESSION: 1. Chronic moderate elevation of the right hemidiaphragm. 2. Medial right lower lung curvilinear subsegmental atelectasis versus scarring, appearing increased from same day 10/09/2020 radiograph but likely similar to prior 10/09/2020 CT. Electronically Signed   By: Yvonne Kendall M.D.   On: 11/17/2021 14:49    Labs on Admission: I have personally reviewed following labs  CBC: Recent Labs  Lab 11/17/21 1056  WBC 6.9  HGB 14.6  HCT 42.6  MCV 93.6  PLT 606*   Basic Metabolic Panel: Recent Labs  Lab 11/17/21 1056 11/17/21 1326  NA 130*  --   K 2.7*  --   CL 82*  --   CO2 38*  --   GLUCOSE 102*  --   BUN 9  --   CREATININE 0.90  --   CALCIUM 7.6*  --   MG  --  1.8  PHOS  --  2.4*   GFR: Estimated Creatinine Clearance: 84 mL/min (by C-G formula based on SCr of 0.9 mg/dL).  Liver Function Tests: Recent Labs  Lab 11/17/21 1056  AST 45*  ALT 18  ALKPHOS 96  BILITOT 1.8*  PROT 6.1*  ALBUMIN 2.2*   Recent Labs  Lab 11/17/21 1056  LIPASE 46   Dr. Tobie Poet [facility]  If 7PM-7AM, please contact overnight-coverage provider If 7AM-7PM, please contact day coverage provider [email or web address]  11/17/2021, 5:42 PM

## 2021-11-17 NOTE — Hospital Course (Addendum)
[name] is a 66 year old male with history of abdominal ascites, alcohol abuse, elevated IgE level, tobacco use, chronic thrombocytopenia, elevated AST compared to ALT consistent with alcohol abuse, who presents emergency department for chief concerns of abdominal distention.  Initial vitals in the emergency department showed temperature 97.5, respiration rate of 18, heart rate of 112, blood pressure 138/82, SPO2 of 98% on room air.  Serum sodium is 130, potassium 2.7, chloride 82, bicarb 38, BUN of 9, serum creatinine of 0.90, GFR greater than 60, nonfasting blood glucose 102, WBC 6.9, hemoglobin 14.6, platelets of 136, AST elevated at 45, ALT is 18.  EDP ordered IR for paracentesis.  ED treatment: Potassium 10 mill equivalent IV to dose ordered.

## 2021-11-17 NOTE — ED Notes (Signed)
Informed RN bed assigned 

## 2021-11-17 NOTE — Assessment & Plan Note (Signed)
-   Presumed secondary to chronic liver cirrhosis however with patient's endorsements of orthopnea, tobacco use, CAD, I have ordered a complete echo - Strict I's and O's - IR has been consulted for paracentesis by EDP - Admit to telemetry medical, inpatient

## 2021-11-17 NOTE — ED Notes (Signed)
ED Provider at bedside. 

## 2021-11-17 NOTE — Assessment & Plan Note (Signed)
-   PRN nicotine patch ordered 

## 2021-11-18 ENCOUNTER — Inpatient Hospital Stay: Payer: Medicaid Other

## 2021-11-18 DIAGNOSIS — K7031 Alcoholic cirrhosis of liver with ascites: Secondary | ICD-10-CM

## 2021-11-18 LAB — CBC
HCT: 37.6 % — ABNORMAL LOW (ref 39.0–52.0)
Hemoglobin: 13.1 g/dL (ref 13.0–17.0)
MCH: 32.9 pg (ref 26.0–34.0)
MCHC: 34.8 g/dL (ref 30.0–36.0)
MCV: 94.5 fL (ref 80.0–100.0)
Platelets: 113 10*3/uL — ABNORMAL LOW (ref 150–400)
RBC: 3.98 MIL/uL — ABNORMAL LOW (ref 4.22–5.81)
RDW: 12.7 % (ref 11.5–15.5)
WBC: 6.1 10*3/uL (ref 4.0–10.5)
nRBC: 0 % (ref 0.0–0.2)

## 2021-11-18 LAB — BASIC METABOLIC PANEL
Anion gap: 8 (ref 5–15)
BUN: 8 mg/dL (ref 8–23)
CO2: 38 mmol/L — ABNORMAL HIGH (ref 22–32)
Calcium: 7.7 mg/dL — ABNORMAL LOW (ref 8.9–10.3)
Chloride: 84 mmol/L — ABNORMAL LOW (ref 98–111)
Creatinine, Ser: 1.01 mg/dL (ref 0.61–1.24)
GFR, Estimated: >60 mL/min (ref >60)
Glucose, Bld: 84 mg/dL (ref 70–99)
Potassium: 3.1 mmol/L — ABNORMAL LOW (ref 3.5–5.1)
Sodium: 130 mmol/L — ABNORMAL LOW (ref 135–145)

## 2021-11-18 LAB — URINALYSIS, ROUTINE W REFLEX MICROSCOPIC
Bacteria, UA: NONE SEEN
Glucose, UA: NEGATIVE mg/dL
Hgb urine dipstick: NEGATIVE
Ketones, ur: 5 mg/dL — AB
Leukocytes,Ua: NEGATIVE
Nitrite: NEGATIVE
Protein, ur: 30 mg/dL — AB
Specific Gravity, Urine: 1.026 (ref 1.005–1.030)
pH: 7 (ref 5.0–8.0)

## 2021-11-18 LAB — HEPATIC FUNCTION PANEL
ALT: 16 U/L (ref 0–44)
AST: 35 U/L (ref 15–41)
Albumin: 2 g/dL — ABNORMAL LOW (ref 3.5–5.0)
Alkaline Phosphatase: 80 U/L (ref 38–126)
Bilirubin, Direct: 0.4 mg/dL — ABNORMAL HIGH (ref 0.0–0.2)
Indirect Bilirubin: 1.1 mg/dL — ABNORMAL HIGH (ref 0.3–0.9)
Total Bilirubin: 1.5 mg/dL — ABNORMAL HIGH (ref 0.3–1.2)
Total Protein: 5.3 g/dL — ABNORMAL LOW (ref 6.5–8.1)

## 2021-11-18 MED ORDER — FUROSEMIDE 10 MG/ML IJ SOLN
40.0000 mg | Freq: Every day | INTRAMUSCULAR | Status: DC
  Administered 2021-11-18: 40 mg via INTRAVENOUS
  Filled 2021-11-18 (×2): qty 4

## 2021-11-18 MED ORDER — SPIRONOLACTONE 25 MG PO TABS
25.0000 mg | ORAL_TABLET | Freq: Every day | ORAL | Status: DC
  Administered 2021-11-18 – 2021-11-21 (×4): 25 mg via ORAL
  Filled 2021-11-18 (×4): qty 1

## 2021-11-18 MED ORDER — POTASSIUM CHLORIDE CRYS ER 20 MEQ PO TBCR
40.0000 meq | EXTENDED_RELEASE_TABLET | ORAL | Status: AC
  Administered 2021-11-18 (×2): 40 meq via ORAL
  Filled 2021-11-18 (×2): qty 2

## 2021-11-18 MED ORDER — SODIUM CHLORIDE 0.9 % IV SOLN
2.0000 g | INTRAVENOUS | Status: DC
  Administered 2021-11-18 – 2021-11-20 (×3): 2 g via INTRAVENOUS
  Filled 2021-11-18: qty 20
  Filled 2021-11-18 (×3): qty 2

## 2021-11-18 NOTE — Progress Notes (Signed)
Attempted to perform in and out cath. Patient's penis is very edematous and he only had about 5 mls of urine outlet. Contacted Sharion Settler NP to inform her of the outcome and she ordered a CT stat. Patient states that he strains to pee but nothing comes out. Abdomen is extremely distended d/t ascites. Patient states that he has had the urination issue for over a month now.

## 2021-11-18 NOTE — Progress Notes (Signed)
Cross Cover Patient with urinary retention ad severe scrotal edema. Nurse unable to obtain urine with catheter. I suspect urinary obstruction ? BPH  CT abdomen/pelvis ordered Potassium supplemented   Kathlene Cote NP

## 2021-11-18 NOTE — Progress Notes (Signed)
PROGRESS NOTE    Paul Willis  JHE:[id number] DOB: 08-22-1955 DOA: 11/17/2021 PCP: Dion Body, MD    Brief Narrative:  66-year-old male with history of abdominal ascites, alcohol abuse, elevated IgE level, tobacco use, chronic thrombocytopenia, elevated AST compared to ALT consistent with alcohol abuse, who presents emergency department for chief concerns of abdominal distention.   Initial vitals in the emergency department showed temperature 97.5, respiration rate of 18, heart rate of 112, blood pressure 138/82, SPO2 of 98% on room air.  He reports his belly has been enlarging for about two weeks. He reprots his last drink was 1 year ago. He reports he used to drink 4-5 glasses per night.    He endorses swelling of his lower extremities, however states that today it is better than most days.Marland Kitchen  He endorses weight gain.  11/4: No acute status changes overnight.  Clinical presentation consistent with decompensated cirrhosis the patient is unclear about the history of chronic liver disease.  Bili is very distended.  IR consultation stated placed by EDP was not actually placed.  I placed consult for ultrasound-guided paracentesis.   Assessment & Plan:   Principal Problem:   Abdominal ascites Active Problems:   Hypokalemia   Asthma   CAD (coronary artery disease)   Alcoholism (HCC)   Alcoholic cirrhosis of liver with ascites (HCC)   Elevated IgE level   Elevated AST (SGOT)   Thrombocytopenia (HCC)   Tobacco dependence  Abdominal ascites Anasarca/fluid overload Decompensated hepatic cirrhosis Patient has history of chronic liver disease but is unclear about his diagnosis of cirrhosis.  He states he previously had an MRI and was determined not to be cirrhotic.  Currently abdominal imaging is consistent with cirrhosis with a small shrunken liver and severe abdominal ascites. Plan: Ultrasound paracentesis ordered (may not be able to be done till Monday 11/6) Rocephin for SBP  prophylaxis 2 g sodium diet with fluid restriction Lasix 40 mg IV daily Aldactone 25 mg daily No evidence of hepatic encephalopathy, no indication for lactulose Monitor for BM  Tobacco dependence As needed nicotine patch  Elevated IgE Followed outpatient by hematology  History of alcohol use disorder Patient is unclear about his last drink.  But stated to admitting physician his last drink was a year ago however when I asked he stated he had been several months.  No history of seizures, tremors, withdrawals  Hypokalemia Appears chronic.  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  Replacement ordered.   DVT prophylaxis: SQ heparin Code Status: Full Family Communication: None today Disposition Plan: Status is: Inpatient Remains inpatient appropriate because: Decompensated cirrhosis, anasarca.  Needs IV diuretics and paracentesis.   Level of care: Telemetry Medical  Consultants:  None   Procedures:  None  Antimicrobials: Rocephin   Subjective: Seen and examined.  Resting in bed.  No visible distress.  Objective: Vitals:   11/17/21 1458 11/17/21 1939 11/18/21 0500 11/18/21 0755  BP: 123/79 127/77 114/84 120/68  Pulse: (!) 102 100 (!) 110 (!) 108  Resp: 20 16 20 16   Temp: 99 F (37.2 C) 98.1 F (36.7 C) 97.9 F (36.6 C) (!) 97.5 F (36.4 C)  TempSrc: Oral Oral Oral Oral  SpO2: 99% 95% 93% 97%  Weight:      Height:        Intake/Output Summary (Last 24 hours) at 11/18/2021 1051 Last data filed at 11/18/2021 1018 Gross per 24 hour  Intake 480 ml  Output 200 ml  Net 280 ml  Filed Weights   11/17/21 1043  Weight: 76.7 kg    Examination:  General exam: NAD.  Frail-appearing Respiratory system: Bibasilar crackles.  Normal work of breathing.  Room air Cardiovascular system: S1-S2, RRR, no murmurs, 2+ pedal edema Gastrointestinal system: Distended, nontender Central nervous system: Alert and oriented. No focal neurological  deficits. Extremities: Symmetric 5 x 5 power. Skin: No rashes, lesions or ulcers Psychiatry: Judgement and insight appear normal. Mood & affect appropriate.     Data Reviewed: I have personally reviewed following labs and imaging studies  CBC: Recent Labs  Lab 11/17/21 1056 11/18/21 0421  WBC 6.9 6.1  HGB 14.6 13.1  HCT 42.6 37.6*  MCV 93.6 94.5  PLT 136* 113*   Basic Metabolic Panel: Recent Labs  Lab 11/17/21 1056 11/17/21 1326 11/18/21 0421  NA 130*  --  130*  K 2.7*  --  3.1*  CL 82*  --  84*  CO2 38*  --  38*  GLUCOSE 102*  --  84  BUN 9  --  8  CREATININE 0.90  --  1.01  CALCIUM 7.6*  --  7.7*  MG  --  1.8  --   PHOS  --  2.4*  --    GFR: Estimated Creatinine Clearance: 74.9 mL/min (by C-G formula based on SCr of 1.01 mg/dL). Liver Function Tests: Recent Labs  Lab 11/17/21 1056 11/18/21 0421  AST 45* 35  ALT 18 16  ALKPHOS 96 80  BILITOT 1.8* 1.5*  PROT 6.1* 5.3*  ALBUMIN 2.2* 2.0*   Recent Labs  Lab 11/17/21 1056  LIPASE 46   Recent Labs  Lab 11/17/21 1504  AMMONIA 20   Coagulation Profile: No results for input(s): "INR", "PROTIME" in the last 168 hours. Cardiac Enzymes: No results for input(s): "CKTOTAL", "CKMB", "CKMBINDEX", "TROPONINI" in the last 168 hours. BNP (last 3 results) No results for input(s): "PROBNP" in the last 8760 hours. HbA1C: No results for input(s): "HGBA1C" in the last 72 hours. CBG: No results for input(s): "GLUCAP" in the last 168 hours. Lipid Profile: No results for input(s): "CHOL", "HDL", "LDLCALC", "TRIG", "CHOLHDL", "LDLDIRECT" in the last 72 hours. Thyroid Function Tests: No results for input(s): "TSH", "T4TOTAL", "FREET4", "T3FREE", "THYROIDAB" in the last 72 hours. Anemia Panel: No results for input(s): "VITAMINB12", "FOLATE", "FERRITIN", "TIBC", "IRON", "RETICCTPCT" in the last 72 hours. Sepsis Labs: No results for input(s): "PROCALCITON", "LATICACIDVEN" in the last 168 hours.  No results found for  this or any previous visit (from the past 240 hour(s)).       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 Severe scrotal edema. EXAM: CT ABDOMEN AND PELVIS WITHOUT CONTRAST TECHNIQUE: Multidetector CT imaging of the abdomen and pelvis was performed following the standard protocol without IV contrast. RADIATION DOSE REDUCTION: This exam was performed according to the departmental dose-optimization program which includes automated exposure control, adjustment of the mA and/or kV according to patient size and/or use of iterative reconstruction technique. COMPARISON:  10/09/2020 FINDINGS: Lower chest: Band of atelectasis at the right lung base. Motion artifact. Gynecomastia Hepatobiliary: Small liver, known history of cirrhosis.Cholelithiasis. No acute biliary inflammation. Pancreas: Unremarkable. Spleen: Unremarkable. Adrenals/Urinary Tract: Negative adrenals. No hydronephrosis or stone. Collapsed bladder which is unremarkable. Stomach/Bowel: No obstruction. No appendicitis or other visible bowel inflammation. Vascular/Lymphatic: No acute vascular abnormality. Extensive atherosclerosis. No mass or adenopathy. Reproductive:No pathologic findings. Other: Large volume ascites. Reticulation of the omentum is likely from the  degree of ascites, no discrete peritoneal based mass. Musculoskeletal: Body wall edema. Lumbar spine degeneration with degenerative appearing sclerosis L2-3. Mild L4-5 anterolisthesis. L4 limbus vertebra. IMPRESSION: 1. No urinary obstruction.  The bladder is collapsed. 2. History of cirrhosis with large volume ascites. Omentum reticulation is likely from the fluid, recommend cytology at time of paracentesis. 3. Cholelithiasis and atherosclerosis. Electronically Signed   By: Tiburcio Pea M.D.   On: 11/18/2021 08:43   DG Chest Port 1 View  Result Date: 11/17/2021 CLINICAL DATA:  Dyspnea on  exertion. EXAM: PORTABLE CHEST 1 VIEW COMPARISON:  Chest radiographs head CT chest 10/09/2020 FINDINGS: There is again moderate elevation of the right hemidiaphragm. Cardiac silhouette and mediastinal contours within normal limits. Medial right lower lung curvilinear subsegmental atelectasis versus scarring, corresponding to similar curvilinear densities on 10/09/2020 CT but appearing increased from same day 10/09/2020 radiograph. The left lung is clear. No pleural effusion or pneumothorax. No acute skeletal abnormality. IMPRESSION: 1. Chronic moderate elevation of the right hemidiaphragm. 2. Medial right lower lung curvilinear subsegmental atelectasis versus scarring, appearing increased from same day 10/09/2020 radiograph but likely similar to prior 10/09/2020 CT. Electronically Signed   By: Neita Garnet M.D.   On: 11/17/2021 14:49        Scheduled Meds:  cyanocobalamin  1,000 mcg Oral Daily   folic acid  1 mg Oral Daily   furosemide  40 mg Intravenous Daily   gabapentin  300 mg Oral QHS   heparin  5,000 Units Subcutaneous Q8H   multivitamin with minerals  1 tablet Oral Daily   potassium chloride  40 mEq Oral Q4H   spironolactone  25 mg Oral Daily   thiamine  100 mg Oral Daily   Continuous Infusions:  cefTRIAXone (ROCEPHIN)  IV       LOS: 1 day    Tresa Moore, MD [facility]   If 7PM-7AM, please contact night-coverage  11/18/2021, 10:51 AM

## 2021-11-19 LAB — COMPREHENSIVE METABOLIC PANEL
ALT: 15 U/L (ref 0–44)
AST: 36 U/L (ref 15–41)
Albumin: 2.2 g/dL — ABNORMAL LOW (ref 3.5–5.0)
Alkaline Phosphatase: 99 U/L (ref 38–126)
Anion gap: 9 (ref 5–15)
BUN: 10 mg/dL (ref 8–23)
CO2: 38 mmol/L — ABNORMAL HIGH (ref 22–32)
Calcium: 8 mg/dL — ABNORMAL LOW (ref 8.9–10.3)
Chloride: 88 mmol/L — ABNORMAL LOW (ref 98–111)
Creatinine, Ser: 1.08 mg/dL (ref 0.61–1.24)
GFR, Estimated: >60 mL/min (ref >60)
Glucose, Bld: 97 mg/dL (ref 70–99)
Potassium: 3.6 mmol/L (ref 3.5–5.1)
Sodium: 135 mmol/L (ref 135–145)
Total Bilirubin: 0.9 mg/dL (ref 0.3–1.2)
Total Protein: 5.9 g/dL — ABNORMAL LOW (ref 6.5–8.1)

## 2021-11-19 MED ORDER — FUROSEMIDE 10 MG/ML IJ SOLN
20.0000 mg | Freq: Every day | INTRAMUSCULAR | Status: DC
  Administered 2021-11-19 – 2021-11-21 (×3): 20 mg via INTRAVENOUS
  Filled 2021-11-19 (×2): qty 4

## 2021-11-19 MED ORDER — GABAPENTIN 300 MG PO CAPS
300.0000 mg | ORAL_CAPSULE | Freq: Every day | ORAL | Status: DC
  Administered 2021-11-19 – 2021-11-21 (×3): 300 mg via ORAL
  Filled 2021-11-19 (×3): qty 1

## 2021-11-19 NOTE — Progress Notes (Signed)
PROGRESS NOTE    Paul Willis  VQM:[id number] DOB: 11-30-1955 DOA: 11/17/2021 PCP: Marisue Ivan, MD    Brief Narrative:  66-year-old male with history of abdominal ascites, alcohol abuse, elevated IgE level, tobacco use, chronic thrombocytopenia, elevated AST compared to ALT consistent with alcohol abuse, who presents emergency department for chief concerns of abdominal distention.   Initial vitals in the emergency department showed temperature 97.5, respiration rate of 18, heart rate of 112, blood pressure 138/82, SPO2 of 98% on room air.  He reports his belly has been enlarging for about two weeks. He reprots his last drink was 1 year ago. He reports he used to drink 4-5 glasses per night.    He endorses swelling of his lower extremities, however states that today it is better than most days.Marland Kitchen  He endorses weight gain.  11/4: No acute status changes overnight.  Clinical presentation consistent with decompensated cirrhosis the patient is unclear about the history of chronic liver disease.  Bili is very distended.  IR consultation stated placed by EDP was not actually placed.  I placed consult for ultrasound-guided paracentesis.  11/5: Pending paracentesis   Assessment & Plan:   Principal Problem:   Abdominal ascites Active Problems:   Hypokalemia   Asthma   CAD (coronary artery disease)   Alcoholism (HCC)   Alcoholic cirrhosis of liver with ascites (HCC)   Elevated IgE level   Elevated AST (SGOT)   Thrombocytopenia (HCC)   Tobacco dependence  Abdominal ascites Anasarca/fluid overload Decompensated hepatic cirrhosis Patient has history of chronic liver disease but is unclear about his diagnosis of cirrhosis.  He states he previously had an MRI and was determined not to be cirrhotic.  Currently abdominal imaging is consistent with cirrhosis with a small shrunken liver and severe abdominal ascites. Plan: Ultrasound paracentesis ordered (may not be able to be done till  Monday 11/6) Continue Rocephin for SBP proph 2 g sodium diet with fluid restriction Lasix 20 mg IV daily Aldactone 25 mg daily No evidence of hepatic encephalopathy, no indication for lactulose Monitor for BM  Tobacco dependence As needed nicotine patch  Elevated IgE Followed outpatient by hematology Resume outpatient followup on dc  History of alcohol use disorder Patient is unclear about his last drink.  But stated to admitting physician his last drink was a year ago however when I asked he stated he had been several months.  No history of seizures, tremors, withdrawals  Hypokalemia Appears chronic.  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  Replacement ordered.   DVT prophylaxis: SQ heparin Code Status: Full Family Communication: None today Disposition Plan: Status is: Inpatient Remains inpatient appropriate because: Decompensated cirrhosis, anasarca.  Needs IV diuretics and paracentesis.   Level of care: Telemetry Medical  Consultants:  None   Procedures:  None  Antimicrobials: Rocephin   Subjective: Seen and examined.  Resting in bed.  No visible distress.  Objective: Vitals:   11/18/21 1602 11/18/21 2044 11/19/21 0605 11/19/21 0722  BP: 106/74 106/73 103/73 113/74  Pulse: 95 98 100 100  Resp:  16 16 14   Temp: 98.1 F (36.7 C) 98 F (36.7 C) 97.7 F (36.5 C) (!) 97.5 F (36.4 C)  TempSrc:  Oral Oral   SpO2: 99% 96% 96% 97%  Weight:      Height:        Intake/Output Summary (Last 24 hours) at 11/19/2021 1102 Last data filed at 11/19/2021 1051 Gross per 24 hour  Intake 520 ml  Output  525 ml  Net -5 ml   Filed Weights   11/17/21 1043  Weight: 76.7 kg    Examination:  General exam: NAD.  Appears chronically ill Respiratory system: Bibasilar crackles.  Normal work of breathing.  Room air Cardiovascular system: S1-S2, RRR, no murmurs, 2+ pedal edema Gastrointestinal system: Distended, nontender, distant bowel sounds Central  nervous system: Alert and oriented. No focal neurological deficits. Extremities: Symmetric 5 x 5 power. Skin: No rashes, lesions or ulcers Psychiatry: Judgement and insight appear normal. Mood & affect appropriate.     Data Reviewed: I have personally reviewed following labs and imaging studies  CBC: Recent Labs  Lab 11/17/21 1056 11/18/21 0421  WBC 6.9 6.1  HGB 14.6 13.1  HCT 42.6 37.6*  MCV 93.6 94.5  PLT 136* 113*   Basic Metabolic Panel: Recent Labs  Lab 11/17/21 1056 11/17/21 1326 11/18/21 0421 11/19/21 0806  NA 130*  --  130* 135  K 2.7*  --  3.1* 3.6  CL 82*  --  84* 88*  CO2 38*  --  38* 38*  GLUCOSE 102*  --  84 97  BUN 9  --  8 10  CREATININE 0.90  --  1.01 1.08  CALCIUM 7.6*  --  7.7* 8.0*  MG  --  1.8  --   --   PHOS  --  2.4*  --   --    GFR: Estimated Creatinine Clearance: 70 mL/min (by C-G formula based on SCr of 1.08 mg/dL). Liver Function Tests: Recent Labs  Lab 11/17/21 1056 11/18/21 0421 11/19/21 0806  AST 45* 35 36  ALT 18 16 15   ALKPHOS 96 80 99  BILITOT 1.8* 1.5* 0.9  PROT 6.1* 5.3* 5.9*  ALBUMIN 2.2* 2.0* 2.2*   Recent Labs  Lab 11/17/21 1056  LIPASE 46   Recent Labs  Lab 11/17/21 1504  AMMONIA 20   Coagulation Profile: No results for input(s): "INR", "PROTIME" in the last 168 hours. Cardiac Enzymes: No results for input(s): "CKTOTAL", "CKMB", "CKMBINDEX", "TROPONINI" in the last 168 hours. BNP (last 3 results) No results for input(s): "PROBNP" in the last 8760 hours. HbA1C: No results for input(s): "HGBA1C" in the last 72 hours. CBG: No results for input(s): "GLUCAP" in the last 168 hours. Lipid Profile: No results for input(s): "CHOL", "HDL", "LDLCALC", "TRIG", "CHOLHDL", "LDLDIRECT" in the last 72 hours. Thyroid Function Tests: No results for input(s): "TSH", "T4TOTAL", "FREET4", "T3FREE", "THYROIDAB" in the last 72 hours. Anemia Panel: No results for input(s): "VITAMINB12", "FOLATE", "FERRITIN", "TIBC", "IRON",  "RETICCTPCT" in the last 72 hours. Sepsis Labs: No results for input(s): "PROCALCITON", "LATICACIDVEN" in the last 168 hours.  No results found for this or any previous visit (from the past 240 hour(s)).       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 Severe scrotal edema. EXAM: CT ABDOMEN AND PELVIS WITHOUT CONTRAST TECHNIQUE: Multidetector CT imaging of the abdomen and pelvis was performed following the standard protocol without IV contrast. RADIATION DOSE REDUCTION: This exam was performed according to the departmental dose-optimization program which includes automated exposure control, adjustment of the mA and/or kV according to patient size and/or use of iterative reconstruction technique. COMPARISON:  10/09/2020 FINDINGS: Lower chest: Band of atelectasis at the right lung base. Motion artifact. Gynecomastia Hepatobiliary: Small liver, known history of cirrhosis.Cholelithiasis. No acute biliary inflammation. Pancreas: Unremarkable. Spleen: Unremarkable. Adrenals/Urinary Tract: Negative adrenals. No hydronephrosis or stone. Collapsed bladder which is  unremarkable. Stomach/Bowel: No obstruction. No appendicitis or other visible bowel inflammation. Vascular/Lymphatic: No acute vascular abnormality. Extensive atherosclerosis. No mass or adenopathy. Reproductive:No pathologic findings. Other: Large volume ascites. Reticulation of the omentum is likely from the degree of ascites, no discrete peritoneal based mass. Musculoskeletal: Body wall edema. Lumbar spine degeneration with degenerative appearing sclerosis L2-3. Mild L4-5 anterolisthesis. L4 limbus vertebra. IMPRESSION: 1. No urinary obstruction.  The bladder is collapsed. 2. History of cirrhosis with large volume ascites. Omentum reticulation is likely from the fluid, recommend cytology at time of paracentesis. 3. Cholelithiasis and atherosclerosis.  Electronically Signed   By: Jorje Guild M.D.   On: 11/18/2021 08:43   DG Chest Port 1 View  Result Date: 11/17/2021 CLINICAL DATA:  Dyspnea on exertion. EXAM: PORTABLE CHEST 1 VIEW COMPARISON:  Chest radiographs head CT chest 10/09/2020 FINDINGS: There is again moderate elevation of the right hemidiaphragm. Cardiac silhouette and mediastinal contours within normal limits. Medial right lower lung curvilinear subsegmental atelectasis versus scarring, corresponding to similar curvilinear densities on 10/09/2020 CT but appearing increased from same day 10/09/2020 radiograph. The left lung is clear. No pleural effusion or pneumothorax. No acute skeletal abnormality. IMPRESSION: 1. Chronic moderate elevation of the right hemidiaphragm. 2. Medial right lower lung curvilinear subsegmental atelectasis versus scarring, appearing increased from same day 10/09/2020 radiograph but likely similar to prior 10/09/2020 CT. Electronically Signed   By: Yvonne Kendall M.D.   On: 11/17/2021 14:49        Scheduled Meds:  cyanocobalamin  1,000 mcg Oral Daily   folic acid  1 mg Oral Daily   furosemide  20 mg Intravenous Daily   gabapentin  300 mg Oral Daily   heparin  5,000 Units Subcutaneous Q8H   multivitamin with minerals  1 tablet Oral Daily   spironolactone  25 mg Oral Daily   thiamine  100 mg Oral Daily   Continuous Infusions:  cefTRIAXone (ROCEPHIN)  IV 2 g (11/19/21 1049)     LOS: 2 days    Sidney Ace, MD [facility]   If 7PM-7AM, please contact night-coverage  11/19/2021, 11:02 AM

## 2021-11-20 ENCOUNTER — Inpatient Hospital Stay: Payer: Medicaid Other

## 2021-11-20 ENCOUNTER — Inpatient Hospital Stay
Admit: 2021-11-20 | Discharge: 2021-11-20 | Disposition: A | Discharge status: Home / Self Care | Payer: Medicaid Other | Attending: Internal Medicine | Admitting: Internal Medicine

## 2021-11-20 DIAGNOSIS — K7031 Alcoholic cirrhosis of liver with ascites: Secondary | ICD-10-CM

## 2021-11-20 LAB — CBC WITH DIFFERENTIAL/PLATELET
Abs Immature Granulocytes: 0.02 10*3/uL (ref 0.00–0.07)
Abs Immature Granulocytes: 0.01 10*3/uL (ref 0.00–0.07)
Basophils Absolute: 0 10*3/uL (ref 0.0–0.1)
Basophils Absolute: 0 10*3/uL (ref 0.0–0.1)
Basophils Relative: 0 %
Basophils Relative: 1 %
Eosinophils Absolute: 0.1 10*3/uL (ref 0.0–0.5)
Eosinophils Absolute: 0.1 10*3/uL (ref 0.0–0.5)
Eosinophils Relative: 2 %
Eosinophils Relative: 2 %
HCT: 36.7 % — ABNORMAL LOW (ref 39.0–52.0)
HCT: 38.2 % — ABNORMAL LOW (ref 39.0–52.0)
Hemoglobin: 12.6 g/dL — ABNORMAL LOW (ref 13.0–17.0)
Hemoglobin: 12.9 g/dL — ABNORMAL LOW (ref 13.0–17.0)
Immature Granulocytes: 0 %
Immature Granulocytes: 0 %
Lymphocytes Relative: 23 %
Lymphocytes Relative: 25 %
Lymphs Abs: 1.2 10*3/uL (ref 0.7–4.0)
Lymphs Abs: 1.1 10*3/uL (ref 0.7–4.0)
MCH: 32.1 pg (ref 26.0–34.0)
MCH: 31.8 pg (ref 26.0–34.0)
MCHC: 34.3 g/dL (ref 30.0–36.0)
MCHC: 33.8 g/dL (ref 30.0–36.0)
MCV: 93.6 fL (ref 80.0–100.0)
MCV: 94.1 fL (ref 80.0–100.0)
Monocytes Absolute: 0.7 10*3/uL (ref 0.1–1.0)
Monocytes Absolute: 0.5 10*3/uL (ref 0.1–1.0)
Monocytes Relative: 13 %
Monocytes Relative: 12 %
Neutro Abs: 3.2 10*3/uL (ref 1.7–7.7)
Neutro Abs: 2.5 10*3/uL (ref 1.7–7.7)
Neutrophils Relative %: 62 %
Neutrophils Relative %: 60 %
Platelets: 98 10*3/uL — ABNORMAL LOW (ref 150–400)
Platelets: 109 10*3/uL — ABNORMAL LOW (ref 150–400)
RBC: 3.92 MIL/uL — ABNORMAL LOW (ref 4.22–5.81)
RBC: 4.06 MIL/uL — ABNORMAL LOW (ref 4.22–5.81)
RDW: 12.8 % (ref 11.5–15.5)
RDW: 12.9 % (ref 11.5–15.5)
WBC: 5.2 10*3/uL (ref 4.0–10.5)
WBC: 4.2 10*3/uL (ref 4.0–10.5)
nRBC: 0 % (ref 0.0–0.2)
nRBC: 0 % (ref 0.0–0.2)

## 2021-11-20 LAB — GLUCOSE, CAPILLARY: Glucose-Capillary: 82 mg/dL (ref 70–99)

## 2021-11-20 LAB — ECHOCARDIOGRAM COMPLETE
Area-P 1/2: 3.77 cm2
Height: 69 in
S' Lateral: 2.1 cm
Weight: 2704 oz

## 2021-11-20 LAB — COMPREHENSIVE METABOLIC PANEL
ALT: 17 U/L (ref 0–44)
AST: 32 U/L (ref 15–41)
Albumin: 2.1 g/dL — ABNORMAL LOW (ref 3.5–5.0)
Alkaline Phosphatase: 84 U/L (ref 38–126)
Anion gap: 8 (ref 5–15)
BUN: 11 mg/dL (ref 8–23)
CO2: 37 mmol/L — ABNORMAL HIGH (ref 22–32)
Calcium: 8 mg/dL — ABNORMAL LOW (ref 8.9–10.3)
Chloride: 89 mmol/L — ABNORMAL LOW (ref 98–111)
Creatinine, Ser: 1.01 mg/dL (ref 0.61–1.24)
GFR, Estimated: >60 mL/min (ref >60)
Glucose, Bld: 93 mg/dL (ref 70–99)
Potassium: 3.3 mmol/L — ABNORMAL LOW (ref 3.5–5.1)
Sodium: 134 mmol/L — ABNORMAL LOW (ref 135–145)
Total Bilirubin: 1.1 mg/dL (ref 0.3–1.2)
Total Protein: 5.8 g/dL — ABNORMAL LOW (ref 6.5–8.1)

## 2021-11-20 LAB — ALBUMIN, PLEURAL OR PERITONEAL FLUID: Albumin, Fluid: <1.5 g/dL

## 2021-11-20 LAB — BODY FLUID CELL COUNT WITH DIFFERENTIAL
Eos, Fluid: 2 %
Lymphs, Fluid: 53 %
Monocyte-Macrophage-Serous Fluid: 27 %
Neutrophil Count, Fluid: 18 %
Total Nucleated Cell Count, Fluid: 103 cu mm

## 2021-11-20 MED ORDER — ALBUMIN HUMAN 25 % IV SOLN
25.0000 g | Freq: Once | INTRAVENOUS | Status: AC
  Administered 2021-11-20: 25 g via INTRAVENOUS
  Filled 2021-11-20: qty 100

## 2021-11-20 MED ORDER — DIPHENHYDRAMINE HCL 50 MG/ML IJ SOLN
25.0000 mg | Freq: Once | INTRAMUSCULAR | Status: AC
  Administered 2021-11-20: 25 mg via INTRAVENOUS
  Filled 2021-11-20: qty 1

## 2021-11-20 MED ORDER — METHYLPREDNISOLONE SODIUM SUCC 125 MG IJ SOLR
125.0000 mg | Freq: Once | INTRAMUSCULAR | Status: AC
  Administered 2021-11-20: 125 mg via INTRAVENOUS
  Filled 2021-11-20: qty 2

## 2021-11-20 MED ORDER — FAMOTIDINE IN NACL 20-0.9 MG/50ML-% IV SOLN
20.0000 mg | Freq: Once | INTRAVENOUS | Status: AC
  Administered 2021-11-20: 20 mg via INTRAVENOUS
  Filled 2021-11-20 (×2): qty 50

## 2021-11-20 MED ORDER — PANTOPRAZOLE SODIUM 40 MG PO TBEC
40.0000 mg | DELAYED_RELEASE_TABLET | Freq: Every day | ORAL | Status: DC
  Administered 2021-11-21: 40 mg via ORAL
  Filled 2021-11-20: qty 1

## 2021-11-20 MED ORDER — LIDOCAINE HCL (PF) 1 % IJ SOLN
10.0000 mL | Freq: Once | INTRAMUSCULAR | Status: AC
  Administered 2021-11-20: 10 mL via INTRADERMAL

## 2021-11-20 MED ORDER — DIPHENHYDRAMINE HCL 50 MG/ML IJ SOLN: 25.0000 mg | Freq: Four times a day (QID) | INTRAMUSCULAR | Status: DC | PRN

## 2021-11-20 MED ORDER — ORAL CARE MOUTH RINSE: 15.0000 mL | OROMUCOSAL | Status: DC | PRN

## 2021-11-20 NOTE — Significant Event (Signed)
Notified by bedside RN regarding possible allergic reaction to albumin  Patient had large volume para today.  8L off.  25g of 25% albumin administered.  Patient now with diffuse pruritic urticarial reaction.  Ate normal dinner.  No other new medications.  Plan: 25mg  IV benadryl, 125mg  IV solumedrol, 20mg  IV pepcid.  ALL IV PUSH STAT  25mg  IV benadyl q6h prn for persistent symptoms  Monitor closely.  Currently no sign of airway compromise.  Satting well. Low threshold for transfer to higher level of care and PCCM engagement if any signs of respiratory compromise  Ralene Muskrat MD

## 2021-11-20 NOTE — Progress Notes (Signed)
PROGRESS NOTE    Paul Willis  HAL:[id number] DOB: 1955/04/03 DOA: 11/17/2021 PCP: Dion Body, MD    Brief Narrative:  66-year-old male with history of abdominal ascites, alcohol abuse, elevated IgE level, tobacco use, chronic thrombocytopenia, elevated AST compared to ALT consistent with alcohol abuse, who presents emergency department for chief concerns of abdominal distention.   Initial vitals in the emergency department showed temperature 97.5, respiration rate of 18, heart rate of 112, blood pressure 138/82, SPO2 of 98% on room air.  He reports his belly has been enlarging for about two weeks. He reprots his last drink was 1 year ago. He reports he used to drink 4-5 glasses per night.    He endorses swelling of his lower extremities, however states that today it is better than most days.Marland Kitchen  He endorses weight gain.  11/4: No acute status changes overnight.  Clinical presentation consistent with decompensated cirrhosis the patient is unclear about the history of chronic liver disease.  Bili is very distended.  IR consultation stated placed by EDP was not actually placed.  I placed consult for ultrasound-guided paracentesis.  11/5: Pending paracentesis 11/6: Paracentesis scheduled for this afternoon.  Bili remains distended.   Assessment & Plan:   Principal Problem:   Abdominal ascites Active Problems:   Hypokalemia   Asthma   CAD (coronary artery disease)   Alcoholism (HCC)   Alcoholic cirrhosis of liver with ascites (HCC)   Elevated IgE level   Elevated AST (SGOT)   Thrombocytopenia (HCC)   Tobacco dependence  Abdominal ascites Anasarca/fluid overload Decompensated hepatic cirrhosis Patient has history of chronic liver disease but is unclear about his diagnosis of cirrhosis.  He states he previously had an MRI and was determined not to be cirrhotic.  Currently abdominal imaging is consistent with cirrhosis with a small shrunken liver and severe abdominal  ascites. Plan: Ultrasound-guided paracentesis today 11/6 Continue Rocephin for SBP proph 2 g sodium diet with fluid restriction Continue Lasix 20 mg IV daily Aldactone 25 mg daily No evidence of hepatic encephalopathy, no indication for lactulose Monitor for BM  Tobacco dependence As needed nicotine patch  Elevated IgE Followed outpatient by hematology Resume outpatient followup on dc  History of alcohol use disorder Patient is unclear about his last drink.  No evidence of withdrawal  Hypokalemia Appears chronic.  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  Replacement ordered.   DVT prophylaxis: SQ heparin Code Status: Full Family Communication: None today Disposition Plan: Status is: Inpatient Remains inpatient appropriate because: Decompensated cirrhosis, anasarca.  Needs IV diuretics and paracentesis.   Level of care: Telemetry Medical  Consultants:  None   Procedures:  None  Antimicrobials: Rocephin   Subjective: Seen and examined.  Resting in bed.  No visible distress.  Objective: Vitals:   11/19/21 1543 11/19/21 2124 11/20/21 0459 11/20/21 0734  BP: 113/72 123/84 102/69 114/63  Pulse: 97 63 100 99  Resp: 18 18 18    Temp: 98.1 F (36.7 C) 98.1 F (36.7 C) 98.1 F (36.7 C) (!) 97.4 F (36.3 C)  TempSrc: Oral  Oral Oral  SpO2: 100% 97% 96% 95%  Weight:      Height:        Intake/Output Summary (Last 24 hours) at 11/20/2021 1118 Last data filed at 11/20/2021 1035 Gross per 24 hour  Intake 660 ml  Output --  Net 660 ml   Filed Weights   11/17/21 1043  Weight: 76.7 kg    Examination:  General exam:  No acute distress Respiratory system: Bibasilar crackles.  Normal work of breathing.  Room air Cardiovascular system: S1-S2, RRR, no murmurs, 2+ pedal edema Gastrointestinal system: Distended, positive fluid wave, nontender, distant bowel sounds Central nervous system: Alert and oriented. No focal neurological  deficits. Extremities: Symmetric 5 x 5 power. Skin: No rashes, lesions or ulcers Psychiatry: Judgement and insight appear normal. Mood & affect appropriate.     Data Reviewed: I have personally reviewed following labs and imaging studies  CBC: Recent Labs  Lab 11/17/21 1056 11/18/21 0421 11/20/21 0428 11/20/21 0803  WBC 6.9 6.1 5.2 4.2  NEUTROABS  --   --  3.2 2.5  HGB 14.6 13.1 12.6* 12.9*  HCT 42.6 37.6* 36.7* 38.2*  MCV 93.6 94.5 93.6 94.1  PLT 136* 113* 98* 109*   Basic Metabolic Panel: Recent Labs  Lab 11/17/21 1056 11/17/21 1326 11/18/21 0421 11/19/21 0806 11/20/21 0803  NA 130*  --  130* 135 134*  K 2.7*  --  3.1* 3.6 3.3*  CL 82*  --  84* 88* 89*  CO2 38*  --  38* 38* 37*  GLUCOSE 102*  --  84 97 93  BUN 9  --  8 10 11   CREATININE 0.90  --  1.01 1.08 1.01  CALCIUM 7.6*  --  7.7* 8.0* 8.0*  MG  --  1.8  --   --   --   PHOS  --  2.4*  --   --   --    GFR: Estimated Creatinine Clearance: 74.9 mL/min (by C-G formula based on SCr of 1.01 mg/dL). Liver Function Tests: Recent Labs  Lab 11/17/21 1056 11/18/21 0421 11/19/21 0806 11/20/21 0803  AST 45* 35 36 32  ALT 18 16 15 17   ALKPHOS 96 80 99 84  BILITOT 1.8* 1.5* 0.9 1.1  PROT 6.1* 5.3* 5.9* 5.8*  ALBUMIN 2.2* 2.0* 2.2* 2.1*   Recent Labs  Lab 11/17/21 1056  LIPASE 46   Recent Labs  Lab 11/17/21 1504  AMMONIA 20   Coagulation Profile: No results for input(s): "INR", "PROTIME" in the last 168 hours. Cardiac Enzymes: No results for input(s): "CKTOTAL", "CKMB", "CKMBINDEX", "TROPONINI" in the last 168 hours. BNP (last 3 results) No results for input(s): "PROBNP" in the last 8760 hours. HbA1C: No results for input(s): "HGBA1C" in the last 72 hours. CBG: Recent Labs  Lab 11/20/21 0735  GLUCAP 82   Lipid Profile: No results for input(s): "CHOL", "HDL", "LDLCALC", "TRIG", "CHOLHDL", "LDLDIRECT" in the last 72 hours. Thyroid Function Tests: No results for input(s): "TSH", "T4TOTAL",  "FREET4", "T3FREE", "THYROIDAB" in the last 72 hours. Anemia Panel: No results for input(s): "VITAMINB12", "FOLATE", "FERRITIN", "TIBC", "IRON", "RETICCTPCT" in the last 72 hours. Sepsis Labs: No results for input(s): "PROCALCITON", "LATICACIDVEN" in the last 168 hours.  No results found for this or any previous visit (from the past 240 hour(s)).       Radiology Studies: No results found.      Scheduled Meds:  cyanocobalamin  1,000 mcg Oral Daily   folic acid  1 mg Oral Daily   furosemide  20 mg Intravenous Daily   gabapentin  300 mg Oral Daily   heparin  5,000 Units Subcutaneous Q8H   multivitamin with minerals  1 tablet Oral Daily   spironolactone  25 mg Oral Daily   thiamine  100 mg Oral Daily   Continuous Infusions:  cefTRIAXone (ROCEPHIN)  IV 2 g (11/20/21 0958)     LOS: 3 days  Tresa Moore, MD [facility]   If 7PM-7AM, please contact night-coverage  11/20/2021, 11:18 AM

## 2021-11-20 NOTE — TOC Initial Note (Signed)
Transition of Care [facility]) - Initial/Assessment Note    Patient Details  Name: Paul Willis MRN: [id number] Date of Birth: 1955/08/01  Transition of Care [facility]) CM/SW Contact:    Beverly Sessions, RN Phone Number: 11/20/2021, 3:40 PM  Clinical Narrative:                     Transition of Care [facility]) Screening Note   Patient Details  Name: Paul Willis Date of Birth: 1955/05/25   Transition of Care [facility]) CM/SW Contact:    Beverly Sessions, RN Phone Number: 11/20/2021, 3:40 PM    Transition of Care Department [facility]) has reviewed patient and no TOC needs have been identified at this time. We will continue to monitor patient advancement through interdisciplinary progression rounds. If new patient transition needs arise, please place a TOC consult.       Patient Goals and CMS Choice        Expected Discharge Plan and Services                                                Prior Living Arrangements/Services                       Activities of Daily Living Home Assistive Devices/Equipment: None ADL Screening (condition at time of admission) Patient's cognitive ability adequate to safely complete daily activities?: Yes Is the patient deaf or have difficulty hearing?: No Does the patient have difficulty seeing, even when wearing glasses/contacts?: No Does the patient have difficulty concentrating, remembering, or making decisions?: No Patient able to express need for assistance with ADLs?: Yes Does the patient have difficulty dressing or bathing?: No Independently performs ADLs?: Yes (appropriate for developmental age) Does the patient have difficulty walking or climbing stairs?: No Weakness of Legs: None Weakness of Arms/Hands: None  Permission Sought/Granted                  Emotional Assessment              Admission diagnosis:  Ascites due to alcoholic cirrhosis ([place]) [W62.03] Abdominal ascites [R18.8] Patient Active Problem  List   Diagnosis Date Noted   Abdominal ascites 11/17/2021   Elevated AST (SGOT) 05/05/2021   Thrombocytopenia ([place]) 05/05/2021   Tobacco dependence 05/05/2021   Elevated IgE level 55/97/4163   Alcoholic cirrhosis of liver with ascites ([place]) 12/02/2020   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), [place] [address] [place]) [place] [address] Phone: [phone] Fax: [phone]     Social Determinants of Health (SDOH) Interventions    Readmission Risk Interventions     No data to display

## 2021-11-20 NOTE — Procedures (Addendum)
PROCEDURE SUMMARY:  Successful ultrasound guided diagnostic and therapeutic paracentesis from the RLQ. Yielded 8 L of clear, yellow fluid.  No immediate complications.  The patient tolerated the procedure well.   If the patient eventually requires >/=2 paracenteses in a 30 day period, screening evaluation by the [facility] will be assessed.  EBL < 1 mL  Tyson Alias, AGNP 11/20/2021 3:34 PM

## 2021-11-21 ENCOUNTER — Inpatient Hospital Stay: Payer: Medicaid Other

## 2021-11-21 LAB — CBC WITH DIFFERENTIAL/PLATELET
Abs Immature Granulocytes: 0.02 10*3/uL (ref 0.00–0.07)
Basophils Absolute: 0 10*3/uL (ref 0.0–0.1)
Basophils Relative: 0 %
Eosinophils Absolute: 0 10*3/uL (ref 0.0–0.5)
Eosinophils Relative: 0 %
HCT: 36 % — ABNORMAL LOW (ref 39.0–52.0)
Hemoglobin: 12.3 g/dL — ABNORMAL LOW (ref 13.0–17.0)
Immature Granulocytes: 0 %
Lymphocytes Relative: 9 %
Lymphs Abs: 0.5 10*3/uL — ABNORMAL LOW (ref 0.7–4.0)
MCH: 31.6 pg (ref 26.0–34.0)
MCHC: 34.2 g/dL (ref 30.0–36.0)
MCV: 92.5 fL (ref 80.0–100.0)
Monocytes Absolute: 0.4 10*3/uL (ref 0.1–1.0)
Monocytes Relative: 8 %
Neutro Abs: 4.8 10*3/uL (ref 1.7–7.7)
Neutrophils Relative %: 83 %
Platelets: 96 10*3/uL — ABNORMAL LOW (ref 150–400)
RBC: 3.89 MIL/uL — ABNORMAL LOW (ref 4.22–5.81)
RDW: 12.6 % (ref 11.5–15.5)
WBC: 5.8 10*3/uL (ref 4.0–10.5)
nRBC: 0 % (ref 0.0–0.2)

## 2021-11-21 LAB — COMPREHENSIVE METABOLIC PANEL
ALT: 15 U/L (ref 0–44)
AST: 31 U/L (ref 15–41)
Albumin: 2.2 g/dL — ABNORMAL LOW (ref 3.5–5.0)
Alkaline Phosphatase: 84 U/L (ref 38–126)
Anion gap: 5 (ref 5–15)
BUN: 13 mg/dL (ref 8–23)
CO2: 35 mmol/L — ABNORMAL HIGH (ref 22–32)
Calcium: 8 mg/dL — ABNORMAL LOW (ref 8.9–10.3)
Chloride: 91 mmol/L — ABNORMAL LOW (ref 98–111)
Creatinine, Ser: 1.06 mg/dL (ref 0.61–1.24)
GFR, Estimated: >60 mL/min (ref >60)
Glucose, Bld: 161 mg/dL — ABNORMAL HIGH (ref 70–99)
Potassium: 3.8 mmol/L (ref 3.5–5.1)
Sodium: 131 mmol/L — ABNORMAL LOW (ref 135–145)
Total Bilirubin: 0.7 mg/dL (ref 0.3–1.2)
Total Protein: 5.4 g/dL — ABNORMAL LOW (ref 6.5–8.1)

## 2021-11-21 MED ORDER — FOLIC ACID 1 MG PO TABS: 1.0000 mg | ORAL_TABLET | Freq: Every day | ORAL | 2 refills | Status: DC

## 2021-11-21 MED ORDER — FUROSEMIDE 20 MG PO TABS: 20.0000 mg | ORAL_TABLET | Freq: Every day | ORAL | 1 refills | Status: DC

## 2021-11-21 MED ORDER — THIAMINE HCL 100 MG PO TABS: 100.0000 mg | ORAL_TABLET | Freq: Every day | ORAL | 2 refills | Status: DC

## 2021-11-21 MED ORDER — SPIRONOLACTONE 25 MG PO TABS: 25.0000 mg | ORAL_TABLET | Freq: Every day | ORAL | 1 refills | Status: DC

## 2021-11-21 NOTE — Discharge Summary (Signed)
Physician Discharge Summary  Paul Willis JME:[id number] DOB: 1955/04/28 DOA: 11/17/2021  PCP: Marisue Ivan, MD  Admit date: 11/17/2021 Discharge date: 11/21/2021  Admitted From: Home Disposition:  Home  Recommendations for Outpatient Follow-up:  Follow up with PCP in 1-2 weeks Follow up with GI in 2 weeks  Home Health:No  Equipment/Devices:None   Discharge Condition:Stable  CODE STATUS:FULL Diet recommendation: Low sodium  Brief/Interim Summary: 66-year-old male with history of abdominal ascites, alcohol abuse, elevated IgE level, tobacco use, chronic thrombocytopenia, elevated AST compared to ALT consistent with alcohol abuse, who presents emergency department for chief concerns of abdominal distention.   Initial vitals in the emergency department showed temperature 97.5, respiration rate of 18, heart rate of 112, blood pressure 138/82, SPO2 of 98% on room air.   He reports his belly has been enlarging for about two weeks. He reprots his last drink was 1 year ago. He reports he used to drink 4-5 glasses per night.    He endorses swelling of his lower extremities, however states that today it is better than most days.Marland Kitchen  He endorses weight gain.   11/4: No acute status changes overnight.  Clinical presentation consistent with decompensated cirrhosis the patient is unclear about the history of chronic liver disease.  Bili is very distended.  IR consultation stated placed by EDP was not actually placed.  I placed consult for ultrasound-guided paracentesis.   11/5: Pending paracentesis 11/6: Paracentesis scheduled for this afternoon.  Belly remains distended. 11/7: S/p para.  8L off.  No sign of SBP. Fluid consistent with EtOH cirrhosis.  Repeat para attempted.  No safe fluid pocket.  OK for discharge home.  Add lasix and aldactone to home meds.  Discussed with GI.  Patient to followup with Dr. Tobi Bastos in 2 weeks.    Discharge Diagnoses:  Principal Problem:   Abdominal  ascites Active Problems:   Hypokalemia   Asthma   CAD (coronary artery disease)   Alcoholism (HCC)   Alcoholic cirrhosis of liver with ascites (HCC)   Elevated IgE level   Elevated AST (SGOT)   Thrombocytopenia (HCC)   Tobacco dependence  Abdominal ascites Anasarca/fluid overload Decompensated hepatic cirrhosis Patient has history of chronic liver disease but is unclear about his diagnosis of cirrhosis.  He states he previously had an MRI and was determined not to be cirrhotic.  Currently abdominal imaging is consistent with cirrhosis with a small shrunken liver and severe abdominal ascites. - S/p para on 11/6, 8L off. No SBP - Possible allergic rxn to the albumin - Repeat para ordered. No safe fluid pocket Plan: DC home Add lasix and aldactone to home meds DC scheduled potassium replacement Follow up outpatient GI and PCP Message sent to Dr. Tobi Bastos.  GI clinic follow up in 2 weeks  Discharge Instructions  Discharge Instructions     Diet - low sodium heart healthy   Complete by: As directed    Increase activity slowly   Complete by: As directed       Allergies as of 11/21/2021       Reactions   Albumin (human) Hives, Itching, Rash        Medication List     STOP taking these medications    polyethylene glycol 17 g packet Commonly known as: MiraLax   RA Potassium Gluconate 595 (99 K) MG Tabs tablet Generic drug: potassium gluconate       TAKE these medications    cyanocobalamin 1000 MCG tablet Commonly known as: VITAMIN B12 Take 1  tablet by mouth daily. Notes to patient: Tomorrow 11/8   folic acid 1 MG tablet Commonly known as: FOLVITE Take 1 tablet (1 mg total) by mouth daily. Notes to patient: Tomorrow 11/8   furosemide 20 MG tablet Commonly known as: Lasix Take 1 tablet (20 mg total) by mouth daily. Notes to patient: Tomorrow 11/8   gabapentin 300 MG capsule Commonly known as: NEURONTIN Take 300 mg by mouth daily after breakfast. Notes to  patient: Tomorrow 11/8   lactulose 10 GM/15ML solution Commonly known as: CHRONULAC Take 15 mLs (10 g total) by mouth 2 (two) times daily.   multivitamin tablet Take 1 tablet by mouth daily. Notes to patient: Tomorrow 11/8   RA Natural Magnesium 250 MG Tabs Generic drug: Magnesium Take 1 tablet by mouth daily.   spironolactone 25 MG tablet Commonly known as: ALDACTONE Take 1 tablet (25 mg total) by mouth daily. Start taking on: November 22, 2021 Notes to patient: Tomorrow 11/8   thiamine 100 MG tablet Commonly known as: VITAMIN B1 Take 1 tablet (100 mg total) by mouth daily. Notes to patient: Tomorrow 11/8        Follow-up Information     Marisue Ivan, MD. Schedule an appointment as soon as possible for a visit in 1 week(s).   Specialty: Family Medicine Contact information: [address] [facility] [place] [address] [phone]         Wyline Mood, MD. Call in 2 week(s).   Specialty: Gastroenterology Why: Dr. Johnney Killian office will set up appointment in 2 weeks.  Please call to confirm date and time Contact information: [address] [place] [address] [phone]                Allergies  Allergen Reactions   Albumin (Human) Hives, Itching and Rash    Consultations: IR   Procedures/Studies: US Abdomen Limited  Result Date: 11/21/2021 CLINICAL DATA:  Known ascites and status post large volume removal of 8 L of fluid yesterday. Assessment for current fluid volume given some degree of residual abdominal distension. EXAM: LIMITED ABDOMEN ULTRASOUND FOR ASCITES TECHNIQUE: Limited ultrasound survey for ascites was performed in all four abdominal quadrants. COMPARISON:  11/20/2021 FINDINGS: Survey of the peritoneal cavity demonstrates some residual ascites. Fluid volume is not nearly to the same degree as prior to paracentesis yesterday. Repeat paracentesis was not performed given small to moderate fluid volume  currently present and the fact that paracentesis was just performed yesterday. IMPRESSION: Small to moderate volume of residual ascites in the peritoneal cavity. Electronically Signed   By: Irish Lack M.D.   On: 11/21/2021 10:45   US Paracentesis  Result Date: 11/20/2021 INDICATION: Patient with history of liver disease, alcohol abuse presented to ED complaining of abdominal distention found to have abdominal ascites. Request received for diagnostic and therapeutic paracentesis with 8 L max. EXAM: ULTRASOUND GUIDED DIAGNOSTIC AND THERAPEUTIC RIGHT LOWER QUADRANT PARACENTESIS MEDICATIONS: 10 mL 1 % lidocaine COMPLICATIONS: None immediate. PROCEDURE: Informed written consent was obtained from the patient after a discussion of the risks, benefits and alternatives to treatment. A timeout was performed prior to the initiation of the procedure. Initial ultrasound scanning demonstrates a large amount of ascites within the right lower abdominal quadrant. The right lower abdomen was prepped and draped in the usual sterile fashion. 1% lidocaine was used for local anesthesia. Following this, a 6 Fr Safe-T-Centesis catheter was introduced. An ultrasound image was saved for documentation purposes. The paracentesis was performed.  The catheter was removed and a dressing was applied. The patient tolerated the procedure well without immediate post procedural complication. Patient received post-procedure intravenous albumin; see nursing notes for details. FINDINGS: A total of approximately 8 L of clear, yellow fluid was removed. Samples were sent to the laboratory as requested by the clinical team. IMPRESSION: Successful ultrasound-guided paracentesis yielding 8 L of peritoneal fluid. Read by: Alex Gardener, AGNP-BC PLAN: If the patient eventually requires >/=2 paracenteses in a 30 day period, candidacy for formal evaluation by the [facility] Interventional Radiology [facility] Hypertension [facility] will be assessed. Roanna Banning, MD  [facility] Radiology Electronically Signed   By: Roanna Banning M.D.   On: 11/20/2021 16:10   ECHOCARDIOGRAM COMPLETE  Result Date: 11/20/2021    ECHOCARDIOGRAM REPORT   Patient Name:   [facility]   Date of Exam: 11/20/2021 Medical Rec #:  [id number]     Height:       69.0 in Accession #:    [id number]    Weight:       169.0 lb Date of Birth:  September 14, 1955    BSA:          1.923 m Patient Age:    65 years      BP:           114/63 mmHg Patient Gender: M             HR:           102 bpm. Exam Location:  [address] Procedure: 2D Echo, Cardiac Doppler and Color Doppler Indications:     Dyspnea R06.00  History:         Patient has no prior history of Echocardiogram examinations.                  Previous Myocardial Infarction; Risk Factors:History of Alcohol                  abuse.  Sonographer:     Thurman Coyer RDCS Referring Phys:  [id number] AMY N COX Diagnosing Phys: Marcina Millard MD IMPRESSIONS  1. Left ventricular ejection fraction, by estimation, is 55 to 60%. The left ventricle has normal function. The left ventricle has no regional wall motion abnormalities. Left ventricular diastolic parameters were normal.  2. Right ventricular systolic function is normal. The right ventricular size is normal.  3. The mitral valve is normal in structure. Trivial mitral valve regurgitation. No evidence of mitral stenosis.  4. The aortic valve is normal in structure. Aortic valve regurgitation is not visualized. No aortic stenosis is present.  5. The inferior vena cava is normal in size with greater than 50% respiratory variability, suggesting right atrial pressure of 3 mmHg. FINDINGS  Left Ventricle: Left ventricular ejection fraction, by estimation, is 55 to 60%. The left ventricle has normal function. The left ventricle has no regional wall motion abnormalities. The left ventricular internal cavity size was normal in size. There is  no left ventricular  hypertrophy. Left ventricular diastolic parameters were normal. Right Ventricle: The right ventricular size is normal. No increase in right ventricular wall thickness. Right ventricular systolic function is normal. Left Atrium: Left atrial size was normal in size. Right Atrium: Right atrial size was normal in size. Pericardium: There is no evidence of pericardial effusion. Mitral Valve: The mitral valve is normal in structure. Trivial mitral valve regurgitation. No evidence of mitral valve stenosis. Tricuspid Valve: The tricuspid valve is normal in structure. Tricuspid valve regurgitation  is trivial. No evidence of tricuspid stenosis. Aortic Valve: The aortic valve is normal in structure. Aortic valve regurgitation is not visualized. No aortic stenosis is present. Pulmonic Valve: The pulmonic valve was normal in structure. Pulmonic valve regurgitation is not visualized. No evidence of pulmonic stenosis. Aorta: The aortic root is normal in size and structure. Venous: The inferior vena cava is normal in size with greater than 50% respiratory variability, suggesting right atrial pressure of 3 mmHg. IAS/Shunts: No atrial level shunt detected by color flow Doppler.  LEFT VENTRICLE PLAX 2D LVIDd:         2.80 cm   Diastology LVIDs:         2.10 cm   LV e' medial:    7.51 cm/s LV PW:         0.90 cm   LV E/e' medial:  8.0 LV IVS:        0.80 cm   LV e' lateral:   15.30 cm/s LVOT diam:     2.10 cm   LV E/e' lateral: 3.9 LV SV:         57 LV SV Index:   30 LVOT Area:     3.46 cm  RIGHT VENTRICLE RV Basal diam:  3.30 cm RV Mid diam:    2.50 cm RV S prime:     13.20 cm/s TAPSE (M-mode): 1.3 cm LEFT ATRIUM             Index        RIGHT ATRIUM          Index LA diam:        2.90 cm 1.51 cm/m   RA Area:     9.50 cm LA Vol (A2C):   27.6 ml 14.35 ml/m  RA Volume:   16.80 ml 8.74 ml/m LA Vol (A4C):   47.0 ml 24.44 ml/m LA Biplane Vol: 38.6 ml 20.07 ml/m  AORTIC VALVE LVOT Vmax:   122.00 cm/s LVOT Vmean:  77.100 cm/s LVOT  VTI:    0.165 m  AORTA Ao Root diam: 2.90 cm MITRAL VALVE MV Area (PHT): 3.77 cm    SHUNTS MV Decel Time: 201 msec    Systemic VTI:  0.16 m MV E velocity: 59.90 cm/s  Systemic Diam: 2.10 cm MV A velocity: 78.50 cm/s MV E/A ratio:  0.76 Isaias Cowman MD Electronically signed by Isaias Cowman MD Signature Date/Time: 11/20/2021/12:51:50 PM    Final    CT ABDOMEN PELVIS WO CONTRAST  Result Date: 11/18/2021 CLINICAL DATA:  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 Severe scrotal edema. EXAM: CT ABDOMEN AND PELVIS WITHOUT CONTRAST TECHNIQUE: Multidetector CT imaging of the abdomen and pelvis was performed following the standard protocol without IV contrast. RADIATION DOSE REDUCTION: This exam was performed according to the departmental dose-optimization program which includes automated exposure control, adjustment of the mA and/or kV according to patient size and/or use of iterative reconstruction technique. COMPARISON:  10/09/2020 FINDINGS: Lower chest: Band of atelectasis at the right lung base. Motion artifact. Gynecomastia Hepatobiliary: Small liver, known history of cirrhosis.Cholelithiasis. No acute biliary inflammation. Pancreas: Unremarkable. Spleen: Unremarkable. Adrenals/Urinary Tract: Negative adrenals. No hydronephrosis or stone. Collapsed bladder which is unremarkable. Stomach/Bowel: No obstruction. No appendicitis or other visible bowel inflammation. Vascular/Lymphatic: No acute vascular abnormality. Extensive atherosclerosis. No mass or adenopathy. Reproductive:No pathologic findings. Other: Large volume ascites. Reticulation of the omentum is likely from the degree of ascites, no discrete peritoneal based mass. Musculoskeletal: Body wall edema. Lumbar spine degeneration with degenerative appearing sclerosis  L2-3. Mild L4-5 anterolisthesis. L4 limbus vertebra. IMPRESSION: 1. No urinary obstruction.  The bladder is collapsed. 2. History of cirrhosis with large volume ascites. Omentum  reticulation is likely from the fluid, recommend cytology at time of paracentesis. 3. Cholelithiasis and atherosclerosis. Electronically Signed   By: Tiburcio PeaJonathan  Watts M.D.   On: 11/18/2021 08:43   DG Chest Port 1 View  Result Date: 11/17/2021 CLINICAL DATA:  Dyspnea on exertion. EXAM: PORTABLE CHEST 1 VIEW COMPARISON:  Chest radiographs head CT chest 10/09/2020 FINDINGS: There is again moderate elevation of the right hemidiaphragm. Cardiac silhouette and mediastinal contours within normal limits. Medial right lower lung curvilinear subsegmental atelectasis versus scarring, corresponding to similar curvilinear densities on 10/09/2020 CT but appearing increased from same day 10/09/2020 radiograph. The left lung is clear. No pleural effusion or pneumothorax. No acute skeletal abnormality. IMPRESSION: 1. Chronic moderate elevation of the right hemidiaphragm. 2. Medial right lower lung curvilinear subsegmental atelectasis versus scarring, appearing increased from same day 10/09/2020 radiograph but likely similar to prior 10/09/2020 CT. Electronically Signed   By: Neita Garnetonald  Viola M.D.   On: 11/17/2021 14:49      Subjective: Seen and examined on day of dc.  Belly distention improved.  Stable for dc home  Discharge Exam: Vitals:   11/21/21 0515 11/21/21 0751  BP: 110/69 102/66  Pulse: 92 87  Resp: 20 16  Temp: (!) 97.5 F (36.4 C) 97.7 F (36.5 C)  SpO2: 94% 97%   Vitals:   11/20/21 1629 11/20/21 1932 11/21/21 0515 11/21/21 0751  BP: 123/69 119/70 110/69 102/66  Pulse: 94 (!) 102 92 87  Resp:  18 20 16   Temp: 97.6 F (36.4 C) 98.1 F (36.7 C) (!) 97.5 F (36.4 C) 97.7 F (36.5 C)  TempSrc:  Oral Oral Oral  SpO2: 100% 97% 94% 97%  Weight:      Height:        General: Pt is alert, awake, not in acute distress Cardiovascular: RRR, S1/S2 +, no rubs, no gallops Respiratory: CTA bilaterally, no wheezing, no rhonchi Abdominal: Soft, mild distention, non tender, +BS Extremities: no edema, no  cyanosis    The results of significant diagnostics from this hospitalization (including imaging, microbiology, ancillary and laboratory) are listed below for reference.     Microbiology: Recent Results (from the past 240 hour(s))  Body fluid culture w Gram Stain     Status: None (Preliminary result)   Collection Time: 11/20/21  2:24 PM   Specimen: PATH Cytology Peritoneal fluid  Result Value Ref Range Status   Specimen Description   Final    PERITONEAL Performed at [facility], [address] [place], [place] [address]    Special Requests   Final    NONE Performed at [facility], [address] [place], [place] [address]    Gram Stain   Final    WBC PRESENT,BOTH PMN AND MONONUCLEAR NO ORGANISMS SEEN CYTOSPIN SMEAR    Culture   Final    NO GROWTH < 24 HOURS Performed at [facility], [address] [place], [place] [address]    Report Status PENDING  Incomplete     Labs: BNP (last 3 results) No results for input(s): "BNP" in the last 8760 hours. Basic Metabolic Panel: Recent Labs  Lab 11/17/21 1056 11/17/21 1326 11/18/21 0421 11/19/21 0806 11/20/21 0803 11/21/21 0840  NA 130*  --  130* 135 134* 131*  K 2.7*  --  3.1* 3.6 3.3* 3.8  CL 82*  --  84* 88* 89* 91*  CO2 38*  --  38* 38* 37* 35*  GLUCOSE 102*  --  84 97 93 161*  BUN 9  --  CREATININE 0.90  --  1.01 1.08 1.01 1.06  CALCIUM 7.6*  --  7.7* 8.0* 8.0* 8.0*  MG  --  1.8  --   --   --   --   PHOS  --  2.4*  --   --   --   --    Liver Function Tests: Recent Labs  Lab 11/17/21 1056 11/18/21 0421 11/19/21 0806 11/20/21 0803 11/21/21 0840  AST 45* 35 36 32 31  ALT ALKPHOS 96 80 99 84 84  BILITOT 1.8* 1.5* 0.9 1.1 0.7  PROT 6.1* 5.3* 5.9* 5.8* 5.4*  ALBUMIN 2.2* 2.0* 2.2* 2.1* 2.2*   Recent Labs  Lab 11/17/21 1056  LIPASE 46   Recent Labs  Lab 11/17/21 1504  AMMONIA 20   CBC: Recent Labs  Lab 11/17/21 1056 11/18/21 0421  11/20/21 0428 11/20/21 0803 11/21/21 0840  WBC 6.9 6.1 5.2 4.2 5.8  NEUTROABS  --   --  3.2 2.5 4.8  HGB 14.6 13.1 12.6* 12.9* 12.3*  HCT 42.6 37.6* 36.7* 38.2* 36.0*  MCV 93.6 94.5 93.6 94.1 92.5  PLT 136* 113* 98* 109* 96*   Cardiac Enzymes: No results for input(s): "CKTOTAL", "CKMB", "CKMBINDEX", "TROPONINI" in the last 168 hours. BNP: Invalid input(s): "POCBNP" CBG: Recent Labs  Lab 11/20/21 0735  GLUCAP 82   D-Dimer No results for input(s): "DDIMER" in the last 72 hours. Hgb A1c No results for input(s): "HGBA1C" in the last 72 hours. Lipid Profile No results for input(s): "CHOL", "HDL", "LDLCALC", "TRIG", "CHOLHDL", "LDLDIRECT" in the last 72 hours. Thyroid function studies No results for input(s): "TSH", "T4TOTAL", "T3FREE", "THYROIDAB" in the last 72 hours.  Invalid input(s): "FREET3" Anemia work up No results for input(s): "VITAMINB12", "FOLATE", "FERRITIN", "TIBC", "IRON", "RETICCTPCT" in the last 72 hours. Urinalysis    Component Value Date/Time   COLORURINE AMBER (A) 11/18/2021 0531   APPEARANCEUR CLEAR (A) 11/18/2021 0531   LABSPEC 1.026 11/18/2021 0531   PHURINE 7.0 11/18/2021 0531   GLUCOSEU NEGATIVE 11/18/2021 0531   HGBUR NEGATIVE 11/18/2021 0531   BILIRUBINUR SMALL (A) 11/18/2021 0531   KETONESUR 5 (A) 11/18/2021 0531   PROTEINUR 30 (A) 11/18/2021 0531   NITRITE NEGATIVE 11/18/2021 0531   LEUKOCYTESUR NEGATIVE 11/18/2021 0531   Sepsis Labs Recent Labs  Lab 11/18/21 0421 11/20/21 0428 11/20/21 0803 11/21/21 0840  WBC 6.1 5.2 4.2 5.8   Microbiology Recent Results (from the past 240 hour(s))  Body fluid culture w Gram Stain     Status: None (Preliminary result)   Collection Time: 11/20/21  2:24 PM   Specimen: PATH Cytology Peritoneal fluid  Result Value Ref Range Status   Specimen Description   Final    PERITONEAL Performed at [facility], [address] [place], [place] [address]    Special Requests   Final     NONE Performed at [facility], [address] [place], [place] [address]    Gram Stain   Final    WBC PRESENT,BOTH PMN AND MONONUCLEAR NO ORGANISMS SEEN CYTOSPIN SMEAR    Culture   Final    NO GROWTH < 24 HOURS Performed at [facility], [address], [place], [place] [address]    Report Status PENDING  Incomplete  Time coordinating discharge: Over 30 minutes  SIGNED:   Tresa Moore, MD  [facility] 11/21/2021, 10:49 AM Pager   If 7PM-7AM, please contact night-coverage

## 2021-11-21 NOTE — Progress Notes (Signed)
Patient presents for  therapeutic  paracentesis. [place] limited abdomen shows small amount of peritoneal fluid.  Insufficient to perform a safe therapeutic paracentesis. Procedure not performed.

## 2021-11-22 LAB — CYTOLOGY - NON PAP

## 2021-11-23 LAB — BODY FLUID CULTURE W GRAM STAIN: Culture: NO GROWTH

## 2021-11-24 ENCOUNTER — Emergency Department
Admission: EM | Admit: 2021-11-24 | Discharge: 2021-11-24 | Disposition: A | Discharge status: Home / Self Care | Payer: Medicaid Other | Attending: Emergency Medicine | Admitting: Emergency Medicine

## 2021-11-24 ENCOUNTER — Other Ambulatory Visit: Payer: Self-pay

## 2021-11-24 DIAGNOSIS — T8189XA Other complications of procedures, not elsewhere classified, initial encounter: Secondary | ICD-10-CM | POA: Diagnosis not present

## 2021-11-24 DIAGNOSIS — Z4800 Encounter for change or removal of nonsurgical wound dressing: Secondary | ICD-10-CM | POA: Diagnosis present

## 2021-11-24 DIAGNOSIS — Z5189 Encounter for other specified aftercare: Secondary | ICD-10-CM

## 2021-11-24 LAB — CBC WITH DIFFERENTIAL/PLATELET
Abs Immature Granulocytes: 0.02 10*3/uL (ref 0.00–0.07)
Basophils Absolute: 0 10*3/uL (ref 0.0–0.1)
Basophils Relative: 0 %
Eosinophils Absolute: 0.1 10*3/uL (ref 0.0–0.5)
Eosinophils Relative: 1 %
HCT: 43.2 % (ref 39.0–52.0)
Hemoglobin: 14.6 g/dL (ref 13.0–17.0)
Immature Granulocytes: 0 %
Lymphocytes Relative: 15 %
Lymphs Abs: 1 10*3/uL (ref 0.7–4.0)
MCH: 31.7 pg (ref 26.0–34.0)
MCHC: 33.8 g/dL (ref 30.0–36.0)
MCV: 93.9 fL (ref 80.0–100.0)
Monocytes Absolute: 0.7 10*3/uL (ref 0.1–1.0)
Monocytes Relative: 10 %
Neutro Abs: 5.1 10*3/uL (ref 1.7–7.7)
Neutrophils Relative %: 74 %
Platelets: 161 10*3/uL (ref 150–400)
RBC: 4.6 MIL/uL (ref 4.22–5.81)
RDW: 12.9 % (ref 11.5–15.5)
WBC: 7 10*3/uL (ref 4.0–10.5)
nRBC: 0 % (ref 0.0–0.2)

## 2021-11-24 LAB — COMPREHENSIVE METABOLIC PANEL
ALT: 32 U/L (ref 0–44)
AST: 54 U/L — ABNORMAL HIGH (ref 15–41)
Albumin: 2.6 g/dL — ABNORMAL LOW (ref 3.5–5.0)
Alkaline Phosphatase: 117 U/L (ref 38–126)
Anion gap: 8 (ref 5–15)
BUN: 12 mg/dL (ref 8–23)
CO2: 32 mmol/L (ref 22–32)
Calcium: 7.8 mg/dL — ABNORMAL LOW (ref 8.9–10.3)
Chloride: 93 mmol/L — ABNORMAL LOW (ref 98–111)
Creatinine, Ser: 0.92 mg/dL (ref 0.61–1.24)
GFR, Estimated: >60 mL/min (ref >60)
Glucose, Bld: 119 mg/dL — ABNORMAL HIGH (ref 70–99)
Potassium: 2.8 mmol/L — ABNORMAL LOW (ref 3.5–5.1)
Sodium: 133 mmol/L — ABNORMAL LOW (ref 135–145)
Total Bilirubin: 1.1 mg/dL (ref 0.3–1.2)
Total Protein: 6.6 g/dL (ref 6.5–8.1)

## 2021-11-24 NOTE — Discharge Instructions (Signed)
Please follow-up with your GI doctor as scheduled.  Return to the emergency department for any further leaking, any abdominal pain or fever.

## 2021-11-24 NOTE — ED Provider Notes (Signed)
   [facility] Provider Note    Event Date/Time   First MD Initiated Contact with Patient 11/24/21 1258     (approximate)  History   Chief Complaint: Wound Check  HPI  Paul Willis is a 66 y.o. male with a past medical history of cirrhosis with ascites who presents to the emergency department for leaking recent paracentesis.  According to the patient approximately 5 or 6 days ago patient had a paracentesis performed.  Patient states this was his first paracentesis.  He was told to take the bandage off after 5 days.  States he took the bandage off yesterday but he has continued to have leaking from the paracentesis puncture site so he came to the emergency department for evaluation.  Patient denies any abdominal pain.  No fever.  Physical Exam   Triage Vital Signs: ED Triage Vitals  Enc Vitals Group     BP 11/24/21 1255 (!) 139/90     Pulse Rate 11/24/21 1255 (!) 109     Resp 11/24/21 1255 19     Temp 11/24/21 1255 97.7 F (36.5 C)     Temp Source 11/24/21 1255 Oral     SpO2 11/24/21 1255 98 %     Weight --      Height --      Head Circumference --      Peak Flow --      Pain Score 11/24/21 1256 0     Pain Loc --      Pain Edu? --      Excl. in GC? --     Most recent vital signs: Vitals:   11/24/21 1255  BP: (!) 139/90  Pulse: (!) 109  Resp: 19  Temp: 97.7 F (36.5 C)  SpO2: 98%    General: Awake, no distress.  CV:  Good peripheral perfusion.  Regular rate and rhythm  Resp:  Normal effort.  Equal breath sounds bilaterally.  Abd:  No distention.  Soft, nontender.   Other:  Small puncture wound to the right mid abdomen from recent paracentesis with a small amount of clear fluid leaking.   ED Results / Procedures / Treatments    MEDICATIONS ORDERED IN ED: Medications - No data to display   IMPRESSION / MDM / ASSESSMENT AND PLAN / ED COURSE  I reviewed the triage vital signs and the nursing notes.  Patient's presentation is most  consistent with acute illness / injury with system symptoms.  Patient presents emergency department for leaking from his paracentesis site.  Patient has a benign abdomen soft and nontender.  Labs are reassuring eluding a normal white blood cell count on his CBC with a normal platelet count.  Chemistry shows normal renal function slight hypokalemia.  Given the patient's leaking from his puncture site I placed the patient in Trendelenburg position to remove pressure from the abdomen.  Was able to place Dermabond to stop the leaking.  Covered with a large Tegaderm after drying.  Continues to be leak free.  We will discharge home.  Patient agreeable to plan.  FINAL CLINICAL IMPRESSION(S) / ED DIAGNOSES   Leaking from recent paracentesis site  Note:  This document was prepared using Dragon voice recognition software and may include unintentional dictation errors.   Minna Antis, MD 11/24/21 1359

## 2021-11-24 NOTE — ED Triage Notes (Signed)
Pt presents to ED with c/o of his "wound spot leaking". Pt states he was admitted and states he  "my abdomen drained" and states where the incision was down to tap him he states has  been leaking. Pt states it has saturated multiple pairs of pants and underwear. Pt denies fevers or chills. Pt also endorses he feels like "he is filling back up".   Pt states groin swelling.

## 2021-12-04 ENCOUNTER — Ambulatory Visit: Payer: Self-pay | Admitting: Gastroenterology

## 2021-12-04 NOTE — Progress Notes (Deleted)
Wyline Mood MD, MRCP(U.K) [address]  [address]  [place], [place] [address]  Main: [phone]  Fax: [phone]   Primary Care Physician: Marisue Ivan, MD  Primary Gastroenterologist:  Dr. Wyline Mood   No chief complaint on file.   HPI: Paul Willis is a 66 y.o. male   Summary of history :   Initially referred and seen on 12/15/2020 for cirrhosis of the liver.  Admitted in 10/04/2020 with shortness of breath.  Found to have cirrhosis of the liver with ascites, gynecomastia it was found on imaging.  History of excess alcohol consumption for many years but he quit in September 2022 no other high risk behaviors. 01/19/2021: EGD: Moderate portal hypertensive gastropathy noted no esophageal varices 12/16/2020: Ultrasound ascites shows no intra-abdominal ascites   12/15/2020: INR 0.9 not immune to hepatitis a and B request vaccination negative hepatitis C.  Autoimmune work-up was negative IgE levels were 8200.  Serology was negative.    12/2020 : MELD 6  No confusion , no constipation. Denies any NSAID use. Not drinking any alcohol. Only issue is leg pains for which he has started gabapentin.     Interval history  02/02/2021-12/04/2021   02/16/2021: Hematology : seen for elevated IGE: Plan was follow up with Dr Cathie Hoops in 02/2021 , wasn't seen subsequently       Current Outpatient Medications  Medication Sig Dispense Refill   folic acid (FOLVITE) 1 MG tablet Take 1 tablet (1 mg total) by mouth daily. 30 tablet 2   furosemide (LASIX) 20 MG tablet Take 1 tablet (20 mg total) by mouth daily. 30 tablet 1   gabapentin (NEURONTIN) 300 MG capsule Take 300 mg by mouth daily after breakfast.     lactulose (CHRONULAC) 10 GM/15ML solution Take 15 mLs (10 g total) by mouth 2 (two) times daily. 600 mL 11   Magnesium (RA NATURAL MAGNESIUM) 250 MG TABS Take 1 tablet by mouth daily.     Multiple Vitamin (MULTIVITAMIN) tablet Take 1 tablet by mouth daily.     spironolactone (ALDACTONE) 25  MG tablet Take 1 tablet (25 mg total) by mouth daily. 30 tablet 1   thiamine (VITAMIN B1) 100 MG tablet Take 1 tablet (100 mg total) by mouth daily. 30 tablet 2   vitamin B-12 (CYANOCOBALAMIN) 1000 MCG tablet Take 1 tablet by mouth daily.     No current facility-administered medications for this visit.    Allergies as of 12/04/2021 - Review Complete 11/24/2021  Allergen Reaction Noted   Albumin (human) Hives, Itching, and Rash 11/21/2021    ROS:  General: Negative for anorexia, weight loss, fever, chills, fatigue, weakness. ENT: Negative for hoarseness, difficulty swallowing , nasal congestion. CV: Negative for chest pain, angina, palpitations, dyspnea on exertion, peripheral edema.  Respiratory: Negative for dyspnea at rest, dyspnea on exertion, cough, sputum, wheezing.  GI: See history of present illness. GU:  Negative for dysuria, hematuria, urinary incontinence, urinary frequency, nocturnal urination.  Endo: Negative for unusual weight change.    Physical Examination:   There were no vitals taken for this visit.  General: Well-nourished, well-developed in no acute distress.  Eyes: No icterus. Conjunctivae pink. Mouth: Oropharyngeal mucosa moist and pink , no lesions erythema or exudate. Lungs: Clear to auscultation bilaterally. Non-labored. Heart: Regular rate and rhythm, no murmurs rubs or gallops.  Abdomen: Bowel sounds are normal, nontender, nondistended, no hepatosplenomegaly or masses, no abdominal bruits or hernia , no rebound or guarding.   Extremities: No lower extremity edema.  No clubbing or deformities. Neuro: Alert and oriented x 3.  Grossly intact. Skin: Warm and dry, no jaundice.   Psych: Alert and cooperative, normal mood and affect.   Imaging Studies: US Abdomen Limited  Result Date: 11/21/2021 CLINICAL DATA:  Known ascites and status post large volume removal of 8 L of fluid yesterday. Assessment for current fluid volume given some degree of residual  abdominal distension. EXAM: LIMITED ABDOMEN ULTRASOUND FOR ASCITES TECHNIQUE: Limited ultrasound survey for ascites was performed in all four abdominal quadrants. COMPARISON:  11/20/2021 FINDINGS: Survey of the peritoneal cavity demonstrates some residual ascites. Fluid volume is not nearly to the same degree as prior to paracentesis yesterday. Repeat paracentesis was not performed given small to moderate fluid volume currently present and the fact that paracentesis was just performed yesterday. IMPRESSION: Small to moderate volume of residual ascites in the peritoneal cavity. Electronically Signed   By: Irish Lack M.D.   On: 11/21/2021 10:45   US Paracentesis  Result Date: 11/20/2021 INDICATION: Patient with history of liver disease, alcohol abuse presented to ED complaining of abdominal distention found to have abdominal ascites. Request received for diagnostic and therapeutic paracentesis with 8 L max. EXAM: ULTRASOUND GUIDED DIAGNOSTIC AND THERAPEUTIC RIGHT LOWER QUADRANT PARACENTESIS MEDICATIONS: 10 mL 1 % lidocaine COMPLICATIONS: None immediate. PROCEDURE: Informed written consent was obtained from the patient after a discussion of the risks, benefits and alternatives to treatment. A timeout was performed prior to the initiation of the procedure. Initial ultrasound scanning demonstrates a large amount of ascites within the right lower abdominal quadrant. The right lower abdomen was prepped and draped in the usual sterile fashion. 1% lidocaine was used for local anesthesia. Following this, a 6 Fr Safe-T-Centesis catheter was introduced. An ultrasound image was saved for documentation purposes. The paracentesis was performed. The catheter was removed and a dressing was applied. The patient tolerated the procedure well without immediate post procedural complication. Patient received post-procedure intravenous albumin; see nursing notes for details. FINDINGS: A total of approximately 8 L of clear, yellow  fluid was removed. Samples were sent to the laboratory as requested by the clinical team. IMPRESSION: Successful ultrasound-guided paracentesis yielding 8 L of peritoneal fluid. Read by: Alex Gardener, AGNP-BC PLAN: If the patient eventually requires >/=2 paracenteses in a 30 day period, candidacy for formal evaluation by the [facility] Interventional Radiology Portal Hypertension [facility] will be assessed. Roanna Banning, MD [facility] Radiology Electronically Signed   By: Roanna Banning M.D.   On: 11/20/2021 16:10   ECHOCARDIOGRAM COMPLETE  Result Date: 11/20/2021    ECHOCARDIOGRAM REPORT   Patient Name:   [facility]   Date of Exam: 11/20/2021 Medical Rec #:  [id number]     Height:       69.0 in Accession #:    [id number]    Weight:       169.0 lb Date of Birth:  04-20-1955    BSA:          1.923 m Patient Age:    65 years      BP:           114/63 mmHg Patient Gender: M             HR:           102 bpm. Exam Location:  [address] Procedure: 2D Echo, Cardiac Doppler and Color Doppler Indications:     Dyspnea R06.00  History:         Patient  has no prior history of Echocardiogram examinations.                  Previous Myocardial Infarction; Risk Factors:History of Alcohol                  abuse.  Sonographer:     Thurman Coyer RDCS Referring Phys:  [id number] AMY N COX Diagnosing Phys: Marcina Millard MD IMPRESSIONS  1. Left ventricular ejection fraction, by estimation, is 55 to 60%. The left ventricle has normal function. The left ventricle has no regional wall motion abnormalities. Left ventricular diastolic parameters were normal.  2. Right ventricular systolic function is normal. The right ventricular size is normal.  3. The mitral valve is normal in structure. Trivial mitral valve regurgitation. No evidence of mitral stenosis.  4. The aortic valve is normal in structure. Aortic valve regurgitation is not visualized. No aortic stenosis is present.  5. The  inferior vena cava is normal in size with greater than 50% respiratory variability, suggesting right atrial pressure of 3 mmHg. FINDINGS  Left Ventricle: Left ventricular ejection fraction, by estimation, is 55 to 60%. The left ventricle has normal function. The left ventricle has no regional wall motion abnormalities. The left ventricular internal cavity size was normal in size. There is  no left ventricular hypertrophy. Left ventricular diastolic parameters were normal. Right Ventricle: The right ventricular size is normal. No increase in right ventricular wall thickness. Right ventricular systolic function is normal. Left Atrium: Left atrial size was normal in size. Right Atrium: Right atrial size was normal in size. Pericardium: There is no evidence of pericardial effusion. Mitral Valve: The mitral valve is normal in structure. Trivial mitral valve regurgitation. No evidence of mitral valve stenosis. Tricuspid Valve: The tricuspid valve is normal in structure. Tricuspid valve regurgitation is trivial. No evidence of tricuspid stenosis. Aortic Valve: The aortic valve is normal in structure. Aortic valve regurgitation is not visualized. No aortic stenosis is present. Pulmonic Valve: The pulmonic valve was normal in structure. Pulmonic valve regurgitation is not visualized. No evidence of pulmonic stenosis. Aorta: The aortic root is normal in size and structure. Venous: The inferior vena cava is normal in size with greater than 50% respiratory variability, suggesting right atrial pressure of 3 mmHg. IAS/Shunts: No atrial level shunt detected by color flow Doppler.  LEFT VENTRICLE PLAX 2D LVIDd:         2.80 cm   Diastology LVIDs:         2.10 cm   LV e' medial:    7.51 cm/s LV PW:         0.90 cm   LV E/e' medial:  8.0 LV IVS:        0.80 cm   LV e' lateral:   15.30 cm/s LVOT diam:     2.10 cm   LV E/e' lateral: 3.9 LV SV:         57 LV SV Index:   30 LVOT Area:     3.46 cm  RIGHT VENTRICLE RV Basal diam:  3.30 cm  RV Mid diam:    2.50 cm RV S prime:     13.20 cm/s TAPSE (M-mode): 1.3 cm LEFT ATRIUM             Index        RIGHT ATRIUM          Index LA diam:        2.90 cm 1.51 cm/m   RA Area:  9.50 cm LA Vol (A2C):   27.6 ml 14.35 ml/m  RA Volume:   16.80 ml 8.74 ml/m LA Vol (A4C):   47.0 ml 24.44 ml/m LA Biplane Vol: 38.6 ml 20.07 ml/m  AORTIC VALVE LVOT Vmax:   122.00 cm/s LVOT Vmean:  77.100 cm/s LVOT VTI:    0.165 m  AORTA Ao Root diam: 2.90 cm MITRAL VALVE MV Area (PHT): 3.77 cm    SHUNTS MV Decel Time: 201 msec    Systemic VTI:  0.16 m MV E velocity: 59.90 cm/s  Systemic Diam: 2.10 cm MV A velocity: 78.50 cm/s MV E/A ratio:  0.76 Marcina Millard MD Electronically signed by Marcina Millard MD Signature Date/Time: 11/20/2021/12:51:50 PM    Final    CT ABDOMEN PELVIS WO CONTRAST  Result Date: 11/18/2021 CLINICAL DATA:  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 Severe scrotal edema. EXAM: CT ABDOMEN AND PELVIS WITHOUT CONTRAST TECHNIQUE: Multidetector CT imaging of the abdomen and pelvis was performed following the standard protocol without IV contrast. RADIATION DOSE REDUCTION: This exam was performed according to the departmental dose-optimization program which includes automated exposure control, adjustment of the mA and/or kV according to patient size and/or use of iterative reconstruction technique. COMPARISON:  10/09/2020 FINDINGS: Lower chest: Band of atelectasis at the right lung base. Motion artifact. Gynecomastia Hepatobiliary: Small liver, known history of cirrhosis.Cholelithiasis. No acute biliary inflammation. Pancreas: Unremarkable. Spleen: Unremarkable. Adrenals/Urinary Tract: Negative adrenals. No hydronephrosis or stone. Collapsed bladder which is unremarkable. Stomach/Bowel: No obstruction. No appendicitis or other visible bowel inflammation. Vascular/Lymphatic: No acute vascular abnormality. Extensive atherosclerosis. No mass or adenopathy. Reproductive:No pathologic findings.  Other: Large volume ascites. Reticulation of the omentum is likely from the degree of ascites, no discrete peritoneal based mass. Musculoskeletal: Body wall edema. Lumbar spine degeneration with degenerative appearing sclerosis L2-3. Mild L4-5 anterolisthesis. L4 limbus vertebra. IMPRESSION: 1. No urinary obstruction.  The bladder is collapsed. 2. History of cirrhosis with large volume ascites. Omentum reticulation is likely from the fluid, recommend cytology at time of paracentesis. 3. Cholelithiasis and atherosclerosis. Electronically Signed   By: Tiburcio Pea M.D.   On: 11/18/2021 08:43   DG Chest Port 1 View  Result Date: 11/17/2021 CLINICAL DATA:  Dyspnea on exertion. EXAM: PORTABLE CHEST 1 VIEW COMPARISON:  Chest radiographs head CT chest 10/09/2020 FINDINGS: There is again moderate elevation of the right hemidiaphragm. Cardiac silhouette and mediastinal contours within normal limits. Medial right lower lung curvilinear subsegmental atelectasis versus scarring, corresponding to similar curvilinear densities on 10/09/2020 CT but appearing increased from same day 10/09/2020 radiograph. The left lung is clear. No pleural effusion or pneumothorax. No acute skeletal abnormality. IMPRESSION: 1. Chronic moderate elevation of the right hemidiaphragm. 2. Medial right lower lung curvilinear subsegmental atelectasis versus scarring, appearing increased from same day 10/09/2020 radiograph but likely similar to prior 10/09/2020 CT. Electronically Signed   By: Neita Garnet M.D.   On: 11/17/2021 14:49    Assessment and Plan:   Paul Willis is a 66 y.o. y/o male  here to follow-up for alcoholic liver cirrhosis with ascites initially which has resolved after stopping alcohol, gynecomastia incidentally found on imaging.  .  Presently no hepatic encephalopathy.  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 12/2020- MELD score 6      Plan 1.  Low-salt diet 2.  Stop all alcohol suggested attend  alcoholic  Anonymous 3.  EGD to screen for esophageal varices in January 2025 4.  In March 2023.  Right upper quadrant ultrasound to  screen for HCC 5.  Full autoimmune and viral hepatitis work-up 6.  Needs hep A/ B vaccine, Pneumococcal vaccine 7.   No ascites seen on recent imaging and if it were to develop in the future would avoid spironolactone as he has developed gynecomastia and will need to use eplerenone 8.  Elevated IGE: didn't see Dr Cathie HoopsYu for followup - will help reschedule     Dr Wyline MoodKiran Breanna Mcdaniel  MD,MRCP [facility](U.K) Follow up in ***

## 2021-12-21 ENCOUNTER — Other Ambulatory Visit: Payer: Self-pay

## 2021-12-21 ENCOUNTER — Emergency Department: Payer: Medicaid Other

## 2021-12-21 ENCOUNTER — Observation Stay
Admission: EM | Admit: 2021-12-21 | Discharge: 2021-12-22 | Disposition: A | Discharge status: Home / Self Care | Payer: Medicaid Other | Attending: Internal Medicine | Admitting: Internal Medicine

## 2021-12-21 ENCOUNTER — Encounter: Payer: Self-pay | Admitting: Emergency Medicine

## 2021-12-21 DIAGNOSIS — E876 Hypokalemia: Secondary | ICD-10-CM | POA: Diagnosis not present

## 2021-12-21 DIAGNOSIS — E871 Hypo-osmolality and hyponatremia: Secondary | ICD-10-CM | POA: Insufficient documentation

## 2021-12-21 DIAGNOSIS — R0600 Dyspnea, unspecified: Secondary | ICD-10-CM | POA: Diagnosis present

## 2021-12-21 DIAGNOSIS — K7031 Alcoholic cirrhosis of liver with ascites: Principal | ICD-10-CM | POA: Insufficient documentation

## 2021-12-21 DIAGNOSIS — F1020 Alcohol dependence, uncomplicated: Secondary | ICD-10-CM | POA: Insufficient documentation

## 2021-12-21 DIAGNOSIS — Z79899 Other long term (current) drug therapy: Secondary | ICD-10-CM | POA: Insufficient documentation

## 2021-12-21 DIAGNOSIS — J441 Chronic obstructive pulmonary disease with (acute) exacerbation: Secondary | ICD-10-CM | POA: Diagnosis present

## 2021-12-21 DIAGNOSIS — F17210 Nicotine dependence, cigarettes, uncomplicated: Secondary | ICD-10-CM | POA: Diagnosis not present

## 2021-12-21 DIAGNOSIS — J45909 Unspecified asthma, uncomplicated: Secondary | ICD-10-CM | POA: Insufficient documentation

## 2021-12-21 DIAGNOSIS — R188 Other ascites: Secondary | ICD-10-CM

## 2021-12-21 LAB — COMPREHENSIVE METABOLIC PANEL
ALT: 17 U/L (ref 0–44)
AST: 38 U/L (ref 15–41)
Albumin: 2.4 g/dL — ABNORMAL LOW (ref 3.5–5.0)
Alkaline Phosphatase: 67 U/L (ref 38–126)
Anion gap: 7 (ref 5–15)
BUN: 9 mg/dL (ref 8–23)
CO2: 32 mmol/L (ref 22–32)
Calcium: 8.1 mg/dL — ABNORMAL LOW (ref 8.9–10.3)
Chloride: 94 mmol/L — ABNORMAL LOW (ref 98–111)
Creatinine, Ser: 0.98 mg/dL (ref 0.61–1.24)
GFR, Estimated: >60 mL/min (ref >60)
Glucose, Bld: 123 mg/dL — ABNORMAL HIGH (ref 70–99)
Potassium: 3.3 mmol/L — ABNORMAL LOW (ref 3.5–5.1)
Sodium: 133 mmol/L — ABNORMAL LOW (ref 135–145)
Total Bilirubin: 0.8 mg/dL (ref 0.3–1.2)
Total Protein: 6.4 g/dL — ABNORMAL LOW (ref 6.5–8.1)

## 2021-12-21 LAB — CBC
HCT: 41.5 % (ref 39.0–52.0)
Hemoglobin: 13.4 g/dL (ref 13.0–17.0)
MCH: 31 pg (ref 26.0–34.0)
MCHC: 32.3 g/dL (ref 30.0–36.0)
MCV: 96.1 fL (ref 80.0–100.0)
Platelets: 151 10*3/uL (ref 150–400)
RBC: 4.32 MIL/uL (ref 4.22–5.81)
RDW: 12.8 % (ref 11.5–15.5)
WBC: 4.8 10*3/uL (ref 4.0–10.5)
nRBC: 0 % (ref 0.0–0.2)

## 2021-12-21 LAB — MAGNESIUM: Magnesium: 1.9 mg/dL (ref 1.7–2.4)

## 2021-12-21 LAB — AMMONIA: Ammonia: 28 umol/L (ref 9–35)

## 2021-12-21 MED ORDER — POTASSIUM CHLORIDE CRYS ER 20 MEQ PO TBCR
40.0000 meq | EXTENDED_RELEASE_TABLET | Freq: Once | ORAL | Status: AC
  Administered 2021-12-21: 40 meq via ORAL
  Filled 2021-12-21: qty 2

## 2021-12-21 MED ORDER — LACTULOSE 10 GM/15ML PO SOLN: 10.0000 g | Freq: Two times a day (BID) | ORAL | Status: DC

## 2021-12-21 MED ORDER — ONDANSETRON HCL 4 MG PO TABS: 4.0000 mg | ORAL_TABLET | Freq: Four times a day (QID) | ORAL | Status: DC | PRN

## 2021-12-21 MED ORDER — FUROSEMIDE 10 MG/ML IJ SOLN
60.0000 mg | Freq: Once | INTRAMUSCULAR | Status: AC
  Administered 2021-12-21: 60 mg via INTRAVENOUS
  Filled 2021-12-21: qty 8

## 2021-12-21 MED ORDER — ACETAMINOPHEN 325 MG PO TABS: 650.0000 mg | ORAL_TABLET | Freq: Four times a day (QID) | ORAL | Status: DC | PRN

## 2021-12-21 MED ORDER — PREDNISONE 20 MG PO TABS
40.0000 mg | ORAL_TABLET | Freq: Every day | ORAL | Status: DC
  Administered 2021-12-21 – 2021-12-22 (×2): 40 mg via ORAL
  Filled 2021-12-21 (×2): qty 2

## 2021-12-21 MED ORDER — SPIRONOLACTONE 25 MG PO TABS
25.0000 mg | ORAL_TABLET | Freq: Every day | ORAL | Status: DC
  Administered 2021-12-22: 25 mg via ORAL
  Filled 2021-12-21: qty 1

## 2021-12-21 MED ORDER — FUROSEMIDE 40 MG PO TABS
20.0000 mg | ORAL_TABLET | Freq: Every day | ORAL | Status: DC
  Administered 2021-12-22: 20 mg via ORAL
  Filled 2021-12-21: qty 1

## 2021-12-21 MED ORDER — HEPARIN SODIUM (PORCINE) 5000 UNIT/ML IJ SOLN
5000.0000 [IU] | Freq: Three times a day (TID) | INTRAMUSCULAR | Status: DC
  Administered 2021-12-21: 5000 [IU] via SUBCUTANEOUS
  Filled 2021-12-21 (×3): qty 1

## 2021-12-21 MED ORDER — ALBUTEROL SULFATE (2.5 MG/3ML) 0.083% IN NEBU
2.5000 mg | INHALATION_SOLUTION | Freq: Once | RESPIRATORY_TRACT | Status: AC
  Administered 2021-12-21: 2.5 mg via RESPIRATORY_TRACT
  Filled 2021-12-21: qty 3

## 2021-12-21 MED ORDER — ALBUTEROL SULFATE (2.5 MG/3ML) 0.083% IN NEBU
2.5000 mg | INHALATION_SOLUTION | Freq: Four times a day (QID) | RESPIRATORY_TRACT | Status: DC
  Filled 2021-12-21: qty 3

## 2021-12-21 MED ORDER — ACETAMINOPHEN 325 MG RE SUPP: 650.0000 mg | Freq: Four times a day (QID) | RECTAL | Status: DC | PRN

## 2021-12-21 MED ORDER — ONDANSETRON HCL 4 MG/2ML IJ SOLN: 4.0000 mg | Freq: Four times a day (QID) | INTRAMUSCULAR | Status: DC | PRN

## 2021-12-21 MED ORDER — SENNOSIDES-DOCUSATE SODIUM 8.6-50 MG PO TABS: 1.0000 | ORAL_TABLET | Freq: Every evening | ORAL | Status: DC | PRN

## 2021-12-21 NOTE — H&P (Addendum)
History and Physical:    Paul Willis   EHU:[id number] DOB: [birth year] DOA: 12/21/2021  Referring MD/provider: Pilar Jarvis, MD PCP: Marisue Ivan, MD   Patient coming from: Home  Chief Complaint: Abdominal distention  History of Present Illness:   Paul Willis is a 66 y.o. male with medical history significant for alcohol use disorder (he said he quit alcohol about 8 months ago), elevated IgE level, tobacco use disorder, alcoholic liver cirrhosis with ascites, recent paracentesis on 11/20/2021 with removal of 8 L of fluid, thrombocytopenia, hypokalemia, COPD/emphysema, asthma, who presented to the emergency department because of increasing abdominal distention.  He said he noticed that his abdomen was getting bigger shortly after his last paracentesis.  Abdominal distention has progressively worsened to the point that he has noticed increasing shortness of breath over the past 3 days.  He decided to come to the emergency department for repeat paracentesis.  He has intermittent wheezing.  No cough chest pain, hematemesis, abdominal pain, bloody stools, fever or chills  ED Course:  The patient   ROS:   ROS all other systems reviewed were negative  Past Medical History:   Past Medical History:  Diagnosis Date   Acute MI (HCC)    Alcohol-induced polyneuropathy (HCC)    Alcoholic cirrhosis of liver with ascites (HCC)    Alcoholism (HCC)    Asthma    Elevated IgE level     Past Surgical History:   Past Surgical History:  Procedure Laterality Date   CARDIAC SURGERY     ESOPHAGOGASTRODUODENOSCOPY (EGD) WITH PROPOFOL N/A 01/19/2021   Procedure: ESOPHAGOGASTRODUODENOSCOPY (EGD) WITH PROPOFOL;  Surgeon: Wyline Mood, MD;  Location: [facility] ENDOSCOPY;  Service: Gastroenterology;  Laterality: N/A;   HERNIA REPAIR Bilateral    age 73    Social History:   Social History   Socioeconomic History   Marital status: Single    Spouse name: Not on file   Number of children: Not on file    Years of education: Not on file   Highest education level: Not on file  Occupational History   Not on file  Tobacco Use   Smoking status: Some Days    Packs/day: 0.50    Types: Cigarettes    Passive exposure: Never   Smokeless tobacco: Never  Vaping Use   Vaping Use: Never used  Substance and Sexual Activity   Alcohol use: Not Currently   Drug use: Never   Sexual activity: Not on file  Other Topics Concern   Not on file  Social History Narrative   Not on file   Social Determinants of Health   Financial Resource Strain: Not on file  Food Insecurity: No Food Insecurity (11/17/2021)   Hunger Vital Sign    Worried About Running Out of Food in the Last Year: Never true    Ran Out of Food in the Last Year: Never true  Transportation Needs: No Transportation Needs (11/17/2021)   PRAPARE - Administrator, Civil Service (Medical): No    Lack of Transportation (Non-Medical): No  Physical Activity: Not on file  Stress: Not on file  Social Connections: Not on file  Intimate Partner Violence: Not At Risk (11/17/2021)   Humiliation, Afraid, Rape, and Kick questionnaire    Fear of Current or Ex-Partner: No    Emotionally Abused: No    Physically Abused: No    Sexually Abused: No    Allergies   Albumin (human)  Family history:   Family History  Problem Relation  Age of Onset   Lung cancer Mother    Stroke Father    Cancer Maternal Uncle    Cancer Paternal Aunt     Current Medications:   Prior to Admission medications   Medication Sig Start Date End Date Taking? Authorizing Provider  folic acid (FOLVITE) 1 MG tablet Take 1 tablet (1 mg total) by mouth daily. 11/21/21   Tresa Moore, MD  furosemide (LASIX) 20 MG tablet Take 1 tablet (20 mg total) by mouth daily. 11/21/21 11/21/22  Tresa Moore, MD  gabapentin (NEURONTIN) 300 MG capsule Take 300 mg by mouth daily after breakfast. 04/18/21   [provider]  lactulose (CHRONULAC) 10 GM/15ML  solution Take 15 mLs (10 g total) by mouth 2 (two) times daily. 12/15/20   Wyline Mood, MD  Magnesium (RA NATURAL MAGNESIUM) 250 MG TABS Take 1 tablet by mouth daily.    [provider]  Multiple Vitamin (MULTIVITAMIN) tablet Take 1 tablet by mouth daily.    [provider]  spironolactone (ALDACTONE) 25 MG tablet Take 1 tablet (25 mg total) by mouth daily. 11/22/21 01/21/22  Tresa Moore, MD  thiamine (VITAMIN B1) 100 MG tablet Take 1 tablet (100 mg total) by mouth daily. 11/21/21   Tresa Moore, MD  vitamin B-12 (CYANOCOBALAMIN) 1000 MCG tablet Take 1 tablet by mouth daily. 02/17/21   [provider]    Physical Exam:   Vitals:   12/21/21 1307 12/21/21 1308 12/21/21 1617 12/21/21 1726  BP:  136/73 120/67 134/72  Pulse:  (!) 102 92 93  Resp:  18 18 18   Temp:  98.2 F (36.8 C) 97.7 F (36.5 C) 97.8 F (36.6 C)  TempSrc:  Oral Oral Oral  SpO2:  97% 92% 95%  Weight: 90.7 kg     Height: 5\' 9"  (1.753 m)        Physical Exam: Blood pressure 134/72, pulse 93, temperature 97.8 F (36.6 C), temperature source Oral, resp. rate 18, height 5\' 9"  (1.753 m), weight 90.7 kg, SpO2 95 %. Gen: No acute distress. Head: Normocephalic, atraumatic. Eyes: Pupils equal, round and reactive to light. Extraocular movements intact.  Sclerae nonicteric.  Mouth: Moist mucous membranes Neck: Supple, no thyromegaly, no lymphadenopathy, no jugular venous distention. Chest: Air entry adequate bilaterally.  Bilateral expiratory wheezing CV: Heart sounds are regular with an S1, S2. No murmurs, rubs or gallops.  Abdomen: Soft, nontender, grossly distended with normal active bowel sounds.  Extremities: Extremities are without clubbing, or cyanosis.  Significant bilateral leg edema without erythema or tenderness Skin: Warm and dry.  Neuro: Alert and oriented times 3; grossly nonfocal.  Psych: Insight is good and judgment is appropriate. Mood and affect normal.   Data Review:     Labs: Basic Metabolic Panel: Recent Labs  Lab 12/21/21 1312  NA 133*  K 3.3*  CL 94*  CO2 32  GLUCOSE 123*  BUN 9  CREATININE 0.98  CALCIUM 8.1*  MG 1.9   Liver Function Tests: Recent Labs  Lab 12/21/21 1312  AST 38  ALT 17  ALKPHOS 67  BILITOT 0.8  PROT 6.4*  ALBUMIN 2.4*   No results for input(s): "LIPASE", "AMYLASE" in the last 168 hours. Recent Labs  Lab 12/21/21 1312  AMMONIA 28   CBC: Recent Labs  Lab 12/21/21 1312  WBC 4.8  HGB 13.4  HCT 41.5  MCV 96.1  PLT 151   Cardiac Enzymes: No results for input(s): "CKTOTAL", "CKMB", "CKMBINDEX", "TROPONINI" in the last  168 hours.  BNP (last 3 results) No results for input(s): "PROBNP" in the last 8760 hours. CBG: No results for input(s): "GLUCAP" in the last 168 hours.  Urinalysis    Component Value Date/Time   COLORURINE AMBER (A) 11/18/2021 0531   APPEARANCEUR CLEAR (A) 11/18/2021 0531   LABSPEC 1.026 11/18/2021 0531   PHURINE 7.0 11/18/2021 0531   GLUCOSEU NEGATIVE 11/18/2021 0531   HGBUR NEGATIVE 11/18/2021 0531   BILIRUBINUR SMALL (A) 11/18/2021 0531   KETONESUR 5 (A) 11/18/2021 0531   PROTEINUR 30 (A) 11/18/2021 0531   NITRITE NEGATIVE 11/18/2021 0531   LEUKOCYTESUR NEGATIVE 11/18/2021 0531      Radiographic Studies: DG Chest 2 View  Result Date: 12/21/2021 CLINICAL DATA:  Shortness of breath EXAM: CHEST - 2 VIEW COMPARISON:  11/17/2021 FINDINGS: Cardiac size is within normal limits. There are no signs of pulmonary edema or focal pulmonary consolidation. Linear dense opacity seen in the medial right lower lung field has not changed, possibly scarring. There is no pleural effusion or pneumothorax. Right hemidiaphragm is elevated. IMPRESSION: There are no new infiltrates or signs of pulmonary edema. Electronically Signed   By: Ernie Avena M.D.   On: 12/21/2021 13:27    EKG: No EKG on file   Assessment/Plan:   Principal Problem:   Ascites due to alcoholic cirrhosis  (HCC) Active Problems:   Hypokalemia   COPD with acute exacerbation (HCC)    Body mass index is 29.53 kg/m.  Alcoholic liver cirrhosis with anasarca (significant ascites and peripheral edema): Admit to MedSurg for observation.  Plan for thoracentesis tomorrow.  Start IV Lasix.  Continue Aldactone.  Recent paracentesis on 11/20/2021 with removal of 8 L of fluid  Hypokalemia: Replete potassium orally and monitor levels.  COPD with wheezing, probable exacerbation: treate with bronchodilators and prednisone  Chronic hyponatremia: This is likely due to liver cirrhosis    Other information:   DVT prophylaxis: heparin injection 5,000 Units Start: 12/21/21 2200Heparin  Code Status: Full code. Family Communication: None Disposition Plan: Plan to discharge home tomorrow Consults called: None Admission status: Observation   The medical decision making is of moderate complexity, therefore this is a level 2 visit.    Alfie Rideaux [facility] Pager: Please check [email or web address]   How to contact the [facility] Attending or Consulting provider 7A - 7P or covering provider during after hours 7P -7A, for this patient?   Check the care team in [facility] and look for a) attending/consulting [facility] provider listed and b) the [facility] team listed Log into [email or web address] and use [place]'s universal password to access. If you do not have the password, please contact the hospital operator. Locate the [facility] provider you are looking for under [facility] and page to a number that you can be directly reached. If you still have difficulty reaching the provider, please page the [facility] (Director on Call) for the Hospitalists listed on amion for assistance.  12/21/2021, 6:17 PM

## 2021-12-21 NOTE — ED Provider Triage Note (Signed)
Emergency Medicine Provider Triage Evaluation Note  Paul Willis, a 66 y.o. male  was evaluated in triage.  Pt complains of ascites.  With a history of cirrhosis states that about 2 months since his last paracentesis.  Endorses abdominal distention as well as shortness of breath.  He also endorses significant weight gain in the last 2 months.  Review of Systems  Positive: ascites Negative: FCS  Physical Exam  BP 136/73   Pulse (!) 102   Temp 98.2 F (36.8 C) (Oral)   Resp 18   Ht 5\' 9"  (1.753 m)   Wt 90.7 kg   SpO2 97%   BMI 29.53 kg/m  Gen:   Awake, no distress  NAD Resp:  Normal effort CTA MSK:   Moves extremities without difficulty  Other:    Medical Decision Making  Medically screening exam initiated at 1:19 PM.  Appropriate orders placed.  was informed that the remainder of the evaluation will be completed by another provider, this initial triage assessment does not replace that evaluation, and the importance of remaining in the ED until their evaluation is complete.  Patient to the ED for evaluation of ascites with a history of cirrhosis of the liver.   Standley Brooking, PA-C 12/21/21 1320

## 2021-12-21 NOTE — ED Notes (Signed)
Pt with grossly distended abdomen, skin taut and shiny.

## 2021-12-21 NOTE — ED Provider Notes (Signed)
[facility] Provider Note    Event Date/Time   First MD Initiated Contact with Patient 12/21/21 1631     (approximate)   History   No chief complaint on file.   HPI  Paul Willis is a 66 y.o. male   Past medical history of alcohol use but no longer uses.  Therapeutic paracentesis about 1 month ago and gradually worsening with distention and now some mild dyspnea.  No significant pain, no fever.    He says that he was to follow-up for scheduling repeat paracenteses but was unable to due to financial concerns and canceled his appointments.  No other medical complaints.  History was obtained via the patient. I reviewed external medical notes including discharge summary dated 11/21/2021 when he got a paracentesis while inpatient with 8 L taken off.     Physical Exam   Triage Vital Signs: ED Triage Vitals  Enc Vitals Group     BP 12/21/21 1308 136/73     Pulse Rate 12/21/21 1308 (!) 102     Resp 12/21/21 1308 18     Temp 12/21/21 1308 98.2 F (36.8 C)     Temp Source 12/21/21 1308 Oral     SpO2 12/21/21 1308 97 %     Weight 12/21/21 1307 200 lb (90.7 kg)     Height 12/21/21 1307 5\' 9"  (1.753 m)     Head Circumference --      Peak Flow --      Pain Score 12/21/21 1307 5     Pain Loc --      Pain Edu? --      Excl. in [place]? --     Most recent vital signs: Vitals:   12/21/21 1308 12/21/21 1617  BP: 136/73 120/67  Pulse: (!) 102 92  Resp: 18 18  Temp: 98.2 F (36.8 C) 97.7 F (36.5 C)  SpO2: 97% 92%    General: Awake, no distress.  CV:  Good peripheral perfusion. Resp:  Normal effort.  Abd:  +distention.  Nontender to palpation.  No overlying skin changes.  Other:   Hemodynamics appropriate and reassuring, no fever no hypoxemia no respiratory distress.  He does have some peripheral edema in his legs.   ED Results / Procedures / Treatments   Labs (all labs ordered are listed, but only abnormal results are displayed) Labs Reviewed   COMPREHENSIVE METABOLIC PANEL - Abnormal; Notable for the following components:      Result Value   Sodium 133 (*)    Potassium 3.3 (*)    Chloride 94 (*)    Glucose, Bld 123 (*)    Calcium 8.1 (*)    Total Protein 6.4 (*)    Albumin 2.4 (*)    All other components within normal limits  CBC  AMMONIA     I reviewed labs and they are notable for no leukocytosis, normal H&H, potassium is mildly low at 3.3.    RADIOLOGY I independently reviewed and interpreted chest x-ray and see no overt pulmonary edema.   PROCEDURES:  Critical Care performed: No  Procedures   MEDICATIONS ORDERED IN ED: Medications - No data to display  Consultants:  I spoke with hospitalist for admission and regarding care plan for this patient.   IMPRESSION / MDM / ASSESSMENT AND PLAN / ED COURSE  I reviewed the triage vital signs and the nursing notes.  Differential diagnosis includes, but is not limited to, ascites, considered SBP but unlikely given benign abdominal exam, pulmonary edema   MDM: Patient with gradually worsening abdominal distention now uncomfortable and short of breath due to abdominal distention.  He has signs of peripheral edema as well.  He was supposed to have another therapeutic paracentesis but canceled due to financial constraints.  His belly is nontender and his hemodynamics are reassuring so I do not think he needs it emergently tonight, and I doubt SBP other intra-abdominal infectious etiologies.  I do not think he has significant pulmonary edema he is not in respiratory distress.  I will consult with interventional radiology to get a therapeutic paracentesis while admitted as an inpatient and he needs coordination for further care as an outpatient.  He states that he has not drank alcohol in over 1 year and he does not appear intoxicated nor does she appear in acute withdrawal.  Patient's presentation is most consistent with acute presentation  with potential threat to life or bodily function.       FINAL CLINICAL IMPRESSION(S) / ED DIAGNOSES   Final diagnoses:  Other ascites     Rx / DC Orders   ED Discharge Orders     None        Note:  This document was prepared using Dragon voice recognition software and may include unintentional dictation errors.    Pilar Jarvis, MD 12/21/21 [phone]

## 2021-12-21 NOTE — ED Notes (Signed)
First Nurse Note: Pt to ED via POV for ascites.

## 2021-12-21 NOTE — ED Triage Notes (Signed)
Pt via POV from home. Pt c/o ascites pt has a hx of cirrhosis of the liver. States its been 2 months since last paracentesis. Pt is SOB from it. Pt is A&XO4 and NAD

## 2021-12-22 ENCOUNTER — Observation Stay: Payer: Medicaid Other

## 2021-12-22 DIAGNOSIS — J441 Chronic obstructive pulmonary disease with (acute) exacerbation: Secondary | ICD-10-CM | POA: Diagnosis not present

## 2021-12-22 DIAGNOSIS — K7031 Alcoholic cirrhosis of liver with ascites: Secondary | ICD-10-CM | POA: Diagnosis not present

## 2021-12-22 DIAGNOSIS — R188 Other ascites: Secondary | ICD-10-CM

## 2021-12-22 DIAGNOSIS — E876 Hypokalemia: Secondary | ICD-10-CM | POA: Diagnosis not present

## 2021-12-22 LAB — BASIC METABOLIC PANEL
Anion gap: 5 (ref 5–15)
BUN: 11 mg/dL (ref 8–23)
CO2: 33 mmol/L — ABNORMAL HIGH (ref 22–32)
Calcium: 8.1 mg/dL — ABNORMAL LOW (ref 8.9–10.3)
Chloride: 100 mmol/L (ref 98–111)
Creatinine, Ser: 0.99 mg/dL (ref 0.61–1.24)
GFR, Estimated: >60 mL/min (ref >60)
Glucose, Bld: 145 mg/dL — ABNORMAL HIGH (ref 70–99)
Potassium: 4.1 mmol/L (ref 3.5–5.1)
Sodium: 138 mmol/L (ref 135–145)

## 2021-12-22 LAB — BODY FLUID CELL COUNT WITH DIFFERENTIAL
Eos, Fluid: 0 %
Lymphs, Fluid: 20 %
Monocyte-Macrophage-Serous Fluid: 73 %
Neutrophil Count, Fluid: 7 %
Total Nucleated Cell Count, Fluid: 194 cu mm

## 2021-12-22 LAB — HIV ANTIBODY (ROUTINE TESTING W REFLEX): HIV Screen 4th Generation wRfx: NONREACTIVE

## 2021-12-22 LAB — MAGNESIUM: Magnesium: 1.9 mg/dL (ref 1.7–2.4)

## 2021-12-22 LAB — PROTIME-INR
INR: 1.1 (ref 0.8–1.2)
Prothrombin Time: 13.7 seconds (ref 11.4–15.2)

## 2021-12-22 LAB — PATHOLOGIST SMEAR REVIEW

## 2021-12-22 MED ORDER — IPRATROPIUM-ALBUTEROL 20-100 MCG/ACT IN AERS: 1.0000 | INHALATION_SPRAY | Freq: Four times a day (QID) | RESPIRATORY_TRACT | 0 refills | Status: DC | PRN

## 2021-12-22 MED ORDER — FUROSEMIDE 40 MG PO TABS: 40.0000 mg | ORAL_TABLET | Freq: Every day | ORAL | 0 refills | Status: DC

## 2021-12-22 MED ORDER — LACTULOSE 10 GM/15ML PO SOLN: 10.0000 g | Freq: Two times a day (BID) | ORAL | 0 refills | Status: DC

## 2021-12-22 MED ORDER — LIDOCAINE HCL (PF) 1 % IJ SOLN
10.0000 mL | Freq: Once | INTRAMUSCULAR | Status: AC
  Administered 2021-12-22: 10 mL via SUBCUTANEOUS

## 2021-12-22 MED ORDER — SPIRONOLACTONE 50 MG PO TABS: 50.0000 mg | ORAL_TABLET | Freq: Every day | ORAL | 0 refills | Status: DC

## 2021-12-22 NOTE — ED Notes (Signed)
Pt transported to US

## 2021-12-22 NOTE — ED Notes (Signed)
Admission MD messaged in regard to pt inquiring about discharge. Awaiting MD response.

## 2021-12-22 NOTE — Progress Notes (Signed)
PROCEDURE SUMMARY:  Successful ultrasound guided paracentesis from the right lower quadrant.  Yielded 12.9 L of clear yellow fluid.  No immediate complications.  The patient tolerated the procedure well.   Specimen sent for labs.  EBL < 2 mL  If the patient eventually requires >/=2 paracenteses in a 30 day period, screening evaluation by the [facility] Interventional Radiology Portal Hypertension Clinic will be assessed.  Alwyn Ren, [place] [phone] 12/22/2021, 2:44 PM

## 2021-12-22 NOTE — Discharge Summary (Signed)
Physician Discharge Summary   Patient: Paul Willis MRN: [id number] DOB: 04-24-1955  Admit date:     12/21/2021  Discharge date: 12/22/21  Discharge Physician: Lurene Shadow   PCP: Marisue Ivan, MD   Recommendations at discharge:    Follow up with PCP as scheduled on 12/26/2021  Discharge Diagnoses: Principal Problem:   Ascites due to alcoholic cirrhosis (HCC) Active Problems:   Hypokalemia   COPD with acute exacerbation (HCC)  Resolved Problems:   * No resolved hospital problems. [facility] Course:  [name] is a 66 y.o. male with medical history significant for alcohol use disorder (he said he quit alcohol about 8 months ago), elevated IgE level, tobacco use disorder, alcoholic liver cirrhosis with ascites, recent paracentesis on 11/20/2021 with removal of 8 L of fluid, thrombocytopenia, hypokalemia, COPD/emphysema, asthma, who presented to the emergency department because of increasing abdominal distention.  He said he noticed that his abdomen was getting bigger shortly after his last paracentesis.  Abdominal distention has progressively worsened to the point that he has noticed increasing shortness of breath over the past 3 days.  He decided to come to the emergency department for repeat paracentesis.  He has intermittent wheezing.   He was admitted to the hospital with severe ascites and peripheral edema (anasarca) from alcoholic liver cirrhosis and probable COPD exacerbation.  He was treated with IV Lasix.  He underwent paracentesis and 12.9 L of ascitic fluid was removed without any complications.  He was treated with prednisone and bronchodilators for suspected COPD exacerbation.  He had hypokalemia that was repleted.  His condition has improved and he was to be discharged home today.  He is deemed stable for discharge.  He said he has an appointment with his PCP on 12/26/2021.  The importance of medical adherence and close follow-up was reiterated.         Consultants: None Procedures performed: None  Disposition: Home Diet recommendation:  Discharge Diet Orders (From admission, onward)     Start     Ordered   12/22/21 0000  Diet - low sodium heart healthy        12/22/21 1554           Cardiac diet DISCHARGE MEDICATION: Allergies as of 12/22/2021       Reactions   Albumin (human) Hives, Itching, Rash   Presumed urticarial reaction.  Responded well to benadryl and steroids. Recommend pre-treatment if albumin given again        Medication List     STOP taking these medications    cyanocobalamin 1000 MCG tablet Commonly known as: VITAMIN B12   gabapentin 300 MG capsule Commonly known as: NEURONTIN   multivitamin tablet   RA Natural Magnesium 250 MG Tabs Generic drug: Magnesium   thiamine 100 MG tablet Commonly known as: VITAMIN B1       TAKE these medications    folic acid 1 MG tablet Commonly known as: FOLVITE Take 1 tablet (1 mg total) by mouth daily.   furosemide 40 MG tablet Commonly known as: Lasix Take 1 tablet (40 mg total) by mouth daily. What changed:  medication strength how much to take   Ipratropium-Albuterol 20-100 MCG/ACT Aers respimat Commonly known as: COMBIVENT Inhale 1 puff into the lungs every 6 (six) hours as needed for wheezing or shortness of breath.   lactulose 10 GM/15ML solution Commonly known as: CHRONULAC Take 15 mLs (10 g total) by mouth 2 (two) times daily.   spironolactone 50 MG tablet  Commonly known as: ALDACTONE Take 1 tablet (50 mg total) by mouth daily. What changed:  medication strength how much to take        Discharge Exam: Filed Weights   12/21/21 1307  Weight: 90.7 kg   GEN: NAD SKIN: Warm and dry EYES: EOMI ENT: MMM CV: RRR PULM: CTA B ABD: soft, abdominal distention has improved significantly, NT, +BS CNS: AAO x 3, non focal EXT: Bilateral leg edema is better   Condition at discharge: good  The results of significant diagnostics  from this hospitalization (including imaging, microbiology, ancillary and laboratory) are listed below for reference.   Imaging Studies: US Paracentesis  Result Date: 12/22/2021 INDICATION: Patient with a history of cirrhosis with ascites. Interventional radiology asked to perform a diagnostic and therapeutic paracentesis. EXAM: ULTRASOUND GUIDED PARACENTESIS MEDICATIONS: 1% lidocaine 10 mL COMPLICATIONS: None immediate. PROCEDURE: Informed written consent was obtained from the patient after a discussion of the risks, benefits and alternatives to treatment. A timeout was performed prior to the initiation of the procedure. Initial ultrasound scanning demonstrates a large amount of ascites within the right lower abdominal quadrant. The right lower abdomen was prepped and draped in the usual sterile fashion. 1% lidocaine was used for local anesthesia. Following this, a 19 gauge, 7-cm, Yueh catheter was introduced. An ultrasound image was saved for documentation purposes. The paracentesis was performed. The catheter was removed and a dressing was applied. The patient tolerated the procedure well without immediate post procedural complication. Patient received post-procedure intravenous albumin; see nursing notes for details. FINDINGS: A total of approximately 12.9 L of clear yellow fluid was removed. Samples were sent to the laboratory as requested by the clinical team. IMPRESSION: Successful ultrasound-guided paracentesis yielding 12.9 liters of peritoneal fluid. Read by: Alwyn Ren, NP PLAN: If the patient eventually requires >/=2 paracenteses in a 30 day period, candidacy for formal evaluation by the [facility] Interventional Radiology Portal Hypertension Clinic will be assessed. Electronically Signed   By: Olive Bass M.D.   On: 12/22/2021 15:07   DG Chest 2 View  Result Date: 12/21/2021 CLINICAL DATA:  Shortness of breath EXAM: CHEST - 2 VIEW COMPARISON:  11/17/2021 FINDINGS: Cardiac size is within  normal limits. There are no signs of pulmonary edema or focal pulmonary consolidation. Linear dense opacity seen in the medial right lower lung field has not changed, possibly scarring. There is no pleural effusion or pneumothorax. Right hemidiaphragm is elevated. IMPRESSION: There are no new infiltrates or signs of pulmonary edema. Electronically Signed   By: Ernie Avena M.D.   On: 12/21/2021 13:27    Microbiology: Results for orders placed or performed in visit on 11/20/21  Body fluid culture w Gram Stain     Status: None   Collection Time: 11/20/21  2:24 PM   Specimen: PATH Cytology Peritoneal fluid  Result Value Ref Range Status   Specimen Description   Final    PERITONEAL Performed at [facility], [address] [place], [place] [address]    Special Requests   Final    NONE Performed at [facility], [address] [place], [place] [address]    Gram Stain   Final    WBC PRESENT,BOTH PMN AND MONONUCLEAR NO ORGANISMS SEEN CYTOSPIN SMEAR    Culture   Final    NO GROWTH Performed at [facility], [address], [place], [place] [address]    Report Status 11/23/2021 FINAL  Final    Labs: CBC: Recent Labs  Lab 12/21/21 1312  WBC 4.8  HGB 13.4  HCT 41.5  MCV 96.1  PLT 151   Basic Metabolic Panel: Recent Labs  Lab 12/21/21 1312 12/22/21 0422  NA 133* 138  K 3.3* 4.1  CL 94* 100  CO2 32 33*  GLUCOSE 123* 145*  BUN 9 11  CREATININE 0.98 0.99  CALCIUM 8.1* 8.1*  MG 1.9 1.9   Liver Function Tests: Recent Labs  Lab 12/21/21 1312  AST 38  ALT 17  ALKPHOS 67  BILITOT 0.8  PROT 6.4*  ALBUMIN 2.4*   CBG: No results for input(s): "GLUCAP" in the last 168 hours.  Discharge time spent: greater than 30 minutes.  Signed: Lurene Shadow, MD [facility] 12/22/2021

## 2021-12-22 NOTE — ED Notes (Signed)
Pt ambulatory to bathroom with steady gait.

## 2021-12-22 NOTE — ED Notes (Addendum)
Pt back from [place] and pulled 12,959ml of fluid off and ambulatory to bathroom with steady gait

## 2021-12-26 LAB — BODY FLUID CULTURE W GRAM STAIN
Culture: NO GROWTH
Gram Stain: NONE SEEN

## 2022-01-04 ENCOUNTER — Encounter: Payer: Self-pay | Admitting: Gastroenterology

## 2022-01-04 ENCOUNTER — Ambulatory Visit (INDEPENDENT_AMBULATORY_CARE_PROVIDER_SITE_OTHER): Payer: Medicaid Other | Admitting: Gastroenterology

## 2022-01-04 VITALS — BP 122/69 | HR 95 | Temp 98.7°F | Wt 198.0 lb

## 2022-01-04 DIAGNOSIS — K7031 Alcoholic cirrhosis of liver with ascites: Secondary | ICD-10-CM | POA: Diagnosis not present

## 2022-01-04 DIAGNOSIS — R768 Other specified abnormal immunological findings in serum: Secondary | ICD-10-CM | POA: Diagnosis not present

## 2022-01-04 MED ORDER — LACTULOSE 10 GM/15ML PO SOLN: 10.0000 g | Freq: Two times a day (BID) | ORAL | 6 refills | Status: DC

## 2022-01-04 NOTE — Patient Instructions (Signed)
We will send your order for you to get a paracentesis done soon. If you ever feel the need to have a paracentesis, you could get one once a week. Just call specialty scheduling to [phone] and ask to speak with Paul Willis.

## 2022-01-04 NOTE — Progress Notes (Signed)
[name] MD, MRCP(U.K) [address]  [place]  [place], [place] [address]  Main: [phone]  Fax: [phone]   Primary Care Physician: Dion Body, MD  Primary Gastroenterologist:  Dr. [name]   Chief Complaint  Patient presents with   Cirrhosis    HPI: Paul Willis is a 66 y.o. male   Summary of history : Initially referred and seen on 12/15/2020 for cirrhosis of the liver.  Admitted in 10/04/2020 with shortness of breath.  Found to have cirrhosis of the liver with ascites, gynecomastia it was found on imaging.  History of excess alcohol consumption for many years but he quit in September 2022 no other high risk behaviors.  01/19/2021: EGD: Moderate portal hypertensive gastropathy noted no esophageal varices 12/16/2020: Ultrasound ascites shows no intra-abdominal ascites   12/15/2020: INR 0.9 not immune to hepatitis a and B request vaccination negative hepatitis C.  Autoimmune work-up was negative IgE levels were 8200.  Serology was negative.    12/2020 : MELD 6    Interval history 02/02/2021-01/04/2022  12/15/2020 not immune to hepatitis A and B otherwise autoimmune panel and testing for viral hepatitis negative  12/04/2021 presented to the hospital with abdominal distention found to have ascites 8 L taken off commenced on Lasix and Aldactone and discharged home On 40 mg Lasix and 50 mg Aldactone  12/22/2021 INR 1.1, creatinine 0.99, fluid cell count showed no evidence of SBP in November 20, 2021 acetic fluid suggestive of portal hypertension related fluid.  11/18/2021 CT abdomen pelvis shows large volume ascites no liver mass seen.   Developed abdominal distention, denies any NSAID use.  Taking his Aldactone and Lasix denies any gynecomastia.   Current Outpatient Medications  Medication Sig Dispense Refill   folic acid (FOLVITE) 1 MG tablet Take 1 tablet (1 mg total) by mouth daily. 30 tablet 2   furosemide (LASIX) 40 MG tablet Take 1 tablet (40 mg  total) by mouth daily. 30 tablet 0   Ipratropium-Albuterol (COMBIVENT) 20-100 MCG/ACT AERS respimat Inhale 1 puff into the lungs every 6 (six) hours as needed for wheezing or shortness of breath. 1 each 0   lactulose (CHRONULAC) 10 GM/15ML solution Take 15 mLs (10 g total) by mouth 2 (two) times daily. 900 mL 0   spironolactone (ALDACTONE) 50 MG tablet Take 1 tablet (50 mg total) by mouth daily. 30 tablet 0   No current facility-administered medications for this visit.    Allergies as of 01/04/2022 - Review Complete 01/04/2022  Allergen Reaction Noted   Albumin (human) Hives, Itching, and Rash 11/21/2021    ROS:  General: Negative for anorexia, weight loss, fever, chills, fatigue, weakness. ENT: Negative for hoarseness, difficulty swallowing , nasal congestion. CV: Negative for chest pain, angina, palpitations, dyspnea on exertion, peripheral edema.  Respiratory: Negative for dyspnea at rest, dyspnea on exertion, cough, sputum, wheezing.  GI: See history of present illness. GU:  Negative for dysuria, hematuria, urinary incontinence, urinary frequency, nocturnal urination.  Endo: Negative for unusual weight change.    Physical Examination:   BP 122/69   Pulse 95   Temp 98.7 F (37.1 C) (Oral)   Wt 198 lb (89.8 kg)   BMI 29.24 kg/m   General: Well-nourished, well-developed in no acute distress.  Eyes: No icterus. Conjunctivae pink. Abdomen: Distended nontender no guarding rigidity fluid thrill present Extremities: No lower extremity edema. No clubbing or deformities. Neuro: Alert and oriented x 3.  Grossly intact. Skin: Warm and dry, no jaundice.  Psych: Alert and cooperative, normal mood and affect. No pedal edema  Imaging Studies: US Paracentesis  Result Date: 12/22/2021 INDICATION: Patient with a history of cirrhosis with ascites. Interventional radiology asked to perform a diagnostic and therapeutic paracentesis. EXAM: ULTRASOUND GUIDED PARACENTESIS MEDICATIONS: 1%  lidocaine 10 mL COMPLICATIONS: None immediate. PROCEDURE: Informed written consent was obtained from the patient after a discussion of the risks, benefits and alternatives to treatment. A timeout was performed prior to the initiation of the procedure. Initial ultrasound scanning demonstrates a large amount of ascites within the right lower abdominal quadrant. The right lower abdomen was prepped and draped in the usual sterile fashion. 1% lidocaine was used for local anesthesia. Following this, a 19 gauge, 7-cm, Yueh catheter was introduced. An ultrasound image was saved for documentation purposes. The paracentesis was performed. The catheter was removed and a dressing was applied. The patient tolerated the procedure well without immediate post procedural complication. Patient received post-procedure intravenous albumin; see nursing notes for details. FINDINGS: A total of approximately 12.9 L of clear yellow fluid was removed. Samples were sent to the laboratory as requested by the clinical team. IMPRESSION: Successful ultrasound-guided paracentesis yielding 12.9 liters of peritoneal fluid. Read by: Alwyn Ren, NP PLAN: If the patient eventually requires >/=2 paracenteses in a 30 day period, candidacy for formal evaluation by the [facility] Interventional Radiology Portal Hypertension Clinic will be assessed. Electronically Signed   By: Olive Bass M.D.   On: 12/22/2021 15:07   DG Chest 2 View  Result Date: 12/21/2021 CLINICAL DATA:  Shortness of breath EXAM: CHEST - 2 VIEW COMPARISON:  11/17/2021 FINDINGS: Cardiac size is within normal limits. There are no signs of pulmonary edema or focal pulmonary consolidation. Linear dense opacity seen in the medial right lower lung field has not changed, possibly scarring. There is no pleural effusion or pneumothorax. Right hemidiaphragm is elevated. IMPRESSION: There are no new infiltrates or signs of pulmonary edema. Electronically Signed   By: Ernie Avena  M.D.   On: 12/21/2021 13:27    Assessment and Plan:   Paul Willis is a 66 y.o. y/o male  here to follow-up for alcoholic liver cirrhosis with ascites initially which has resolved after stopping alcohol, gynecomastia incidentally found on imaging.  .  Presently no hepatic encephalopathy.  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 12/2020- MELD score 6, recent decompensation in December 2023 large quantity of ascites drained.     Plan 1.  Low-salt diet 2.  Stop all alcohol, large-volume paracentesis 3.  EGD to screen for esophageal varices due to recent decompensation 4.  Check AFP today and right upper quadrant ultrasound in May 2024, Doppler ultrasound of the liver to rule out portal vein thrombosis 5.  Recheck IgE levels previously elevated will refer to hematology 6.  Needs hep A/ B vaccine, Pneumococcal vaccine 7.  Has tense ascites continue Aldactone 50 mg and Lasix of 40 mg based on the CMP from today we will increase the dose.  If unable to control then may need a TIPS procedure.  Advised to go on a low-salt diet discussed about the type of foods to avoid avoid NSAIDs   I have discussed alternative options, risks & benefits,  which include, but are not limited to, bleeding, infection, perforation,respiratory complication & drug reaction.  The patient agrees with this plan & written consent will be obtained.     Dr Wyline Mood  MD,MRCP [facility]) Follow up in 2 to 3 weeks

## 2022-01-05 ENCOUNTER — Telehealth: Payer: Self-pay

## 2022-01-05 DIAGNOSIS — R768 Other specified abnormal immunological findings in serum: Secondary | ICD-10-CM

## 2022-01-05 NOTE — Telephone Encounter (Signed)
-----   Message from Wyline Mood, MD sent at 01/05/2022 10:32 AM EST ----- BMP stable   1. Increase aldactone to 100 mg a day  2. Keep lasix at 40 mg a dayu  3. BMP in 1 week

## 2022-01-05 NOTE — Progress Notes (Signed)
BMP stable   1. Increase aldactone to 100 mg a day  2. Keep lasix at 40 mg a dayu  3. BMP in 1 week

## 2022-01-05 NOTE — Telephone Encounter (Signed)
Called patient to let him know the below information and he stated that he would follow. Lab was ordered.

## 2022-01-10 ENCOUNTER — Ambulatory Visit
Admission: RE | Admit: 2022-01-10 | Discharge: 2022-01-10 | Disposition: A | Discharge status: Home / Self Care | Payer: Medicaid Other | Source: Ambulatory Visit | Attending: Gastroenterology | Admitting: Gastroenterology

## 2022-01-10 DIAGNOSIS — K7031 Alcoholic cirrhosis of liver with ascites: Secondary | ICD-10-CM | POA: Insufficient documentation

## 2022-01-10 DIAGNOSIS — R768 Other specified abnormal immunological findings in serum: Secondary | ICD-10-CM | POA: Insufficient documentation

## 2022-01-10 MED ORDER — LIDOCAINE HCL (PF) 1 % IJ SOLN
9.0000 mL | Freq: Once | INTRAMUSCULAR | Status: AC
  Administered 2022-01-10: 9 mL via INTRADERMAL
  Filled 2022-01-10: qty 10

## 2022-01-10 NOTE — Discharge Instructions (Signed)
Given and explained to patient by [facility]

## 2022-01-11 ENCOUNTER — Telehealth: Payer: Self-pay

## 2022-01-11 ENCOUNTER — Other Ambulatory Visit: Payer: Self-pay

## 2022-01-11 ENCOUNTER — Ambulatory Visit: Admission: RE | Admit: 2022-01-11 | Payer: Medicaid Other | Source: Ambulatory Visit

## 2022-01-11 DIAGNOSIS — I8500 Esophageal varices without bleeding: Secondary | ICD-10-CM

## 2022-01-11 DIAGNOSIS — R768 Other specified abnormal immunological findings in serum: Secondary | ICD-10-CM

## 2022-01-11 LAB — CBC WITH DIFFERENTIAL/PLATELET
Basophils Absolute: 0 10*3/uL (ref 0.0–0.2)
Basos: 0 %
EOS (ABSOLUTE): 0.1 10*3/uL (ref 0.0–0.4)
Eos: 1 %
Hematocrit: 40.2 % (ref 37.5–51.0)
Hemoglobin: 13.9 g/dL (ref 13.0–17.7)
Immature Grans (Abs): 0 10*3/uL (ref 0.0–0.1)
Immature Granulocytes: 0 %
Lymphocytes Absolute: 1 10*3/uL (ref 0.7–3.1)
Lymphs: 13 %
MCH: 31.3 pg (ref 26.6–33.0)
MCHC: 34.6 g/dL (ref 31.5–35.7)
MCV: 91 fL (ref 79–97)
Monocytes Absolute: 0.6 10*3/uL (ref 0.1–0.9)
Monocytes: 8 %
Neutrophils Absolute: 5.6 10*3/uL (ref 1.4–7.0)
Neutrophils: 78 %
Platelets: 164 10*3/uL (ref 150–450)
RBC: 4.44 x10E6/uL (ref 4.14–5.80)
RDW: 12.4 % (ref 11.6–15.4)
WBC: 7.3 10*3/uL (ref 3.4–10.8)

## 2022-01-11 LAB — COMPREHENSIVE METABOLIC PANEL
ALT: 20 IU/L (ref 0–44)
AST: 33 IU/L (ref 0–40)
Albumin/Globulin Ratio: 0.8 — ABNORMAL LOW (ref 1.2–2.2)
Albumin: 2.6 g/dL — ABNORMAL LOW (ref 3.9–4.9)
Alkaline Phosphatase: 104 IU/L (ref 44–121)
BUN/Creatinine Ratio: 11 (ref 10–24)
BUN: 10 mg/dL (ref 8–27)
Bilirubin Total: 0.4 mg/dL (ref 0.0–1.2)
CO2: 29 mmol/L (ref 20–29)
Calcium: 8.1 mg/dL — ABNORMAL LOW (ref 8.6–10.2)
Chloride: 95 mmol/L — ABNORMAL LOW (ref 96–106)
Creatinine, Ser: 0.95 mg/dL (ref 0.76–1.27)
Globulin, Total: 3.4 g/dL (ref 1.5–4.5)
Glucose: 161 mg/dL — ABNORMAL HIGH (ref 70–99)
Potassium: 4.2 mmol/L (ref 3.5–5.2)
Sodium: 133 mmol/L — ABNORMAL LOW (ref 134–144)
Total Protein: 6 g/dL (ref 6.0–8.5)
eGFR: 89 mL/min/{1.73_m2} (ref >59)

## 2022-01-11 LAB — IGE: IgE (Immunoglobulin E), Serum: 9920 IU/mL — ABNORMAL HIGH (ref 6–495)

## 2022-01-11 LAB — AFP TUMOR MARKER: AFP, Serum, Tumor Marker: 4.2 ng/mL (ref 0.0–8.4)

## 2022-01-11 NOTE — Telephone Encounter (Signed)
Called patient to let him know that his IGE had been persistently elevated and therefore, Dr. Tobi Bastos would like for him to see a hematologist. Patient agreed. Patient also stated that he needed to schedule an upper endoscopy and he agreed to have it done next week on 01/17/2022 to check on his esophageal varices.

## 2022-01-11 NOTE — Telephone Encounter (Signed)
-----   Message from Wyline Mood, MD sent at 01/11/2022  7:44 AM EST ----- Inform IGE persistently elevated- refer to hematology for eval

## 2022-01-11 NOTE — Progress Notes (Signed)
Inform IGE persistently elevated- refer to hematology for eval

## 2022-01-11 NOTE — Addendum Note (Signed)
Addended by: Adela Ports on: 01/11/2022 05:54 PM   Modules accepted: Orders

## 2022-01-12 ENCOUNTER — Telehealth: Payer: Self-pay | Admitting: *Deleted

## 2022-01-12 NOTE — Telephone Encounter (Signed)
Pt called requesting a change in his appointment time on January 16, 2022.  Nurse instructed she would notify scheduler and someone would call him back to change appt.  Pt verbalized understanding.

## 2022-01-16 ENCOUNTER — Other Ambulatory Visit: Payer: Self-pay

## 2022-01-16 ENCOUNTER — Encounter: Payer: Self-pay | Admitting: Oncology

## 2022-01-16 ENCOUNTER — Inpatient Hospital Stay: Payer: Medicaid Other

## 2022-01-16 ENCOUNTER — Inpatient Hospital Stay: Payer: Medicaid Other | Attending: Oncology | Admitting: Oncology

## 2022-01-16 VITALS — BP 107/69 | HR 102 | Temp 97.5°F | Resp 18 | Ht 69.0 in | Wt 151.6 lb

## 2022-01-16 DIAGNOSIS — R768 Other specified abnormal immunological findings in serum: Secondary | ICD-10-CM | POA: Diagnosis not present

## 2022-01-16 DIAGNOSIS — F17210 Nicotine dependence, cigarettes, uncomplicated: Secondary | ICD-10-CM | POA: Insufficient documentation

## 2022-01-16 DIAGNOSIS — K7030 Alcoholic cirrhosis of liver without ascites: Secondary | ICD-10-CM | POA: Diagnosis not present

## 2022-01-16 LAB — CBC WITH DIFFERENTIAL/PLATELET
Abs Immature Granulocytes: 0.02 10*3/uL (ref 0.00–0.07)
Basophils Absolute: 0 10*3/uL (ref 0.0–0.1)
Basophils Relative: 0 %
Eosinophils Absolute: 0.1 10*3/uL (ref 0.0–0.5)
Eosinophils Relative: 2 %
HCT: 38.3 % — ABNORMAL LOW (ref 39.0–52.0)
Hemoglobin: 12.9 g/dL — ABNORMAL LOW (ref 13.0–17.0)
Immature Granulocytes: 0 %
Lymphocytes Relative: 18 %
Lymphs Abs: 1 10*3/uL (ref 0.7–4.0)
MCH: 30.4 pg (ref 26.0–34.0)
MCHC: 33.7 g/dL (ref 30.0–36.0)
MCV: 90.1 fL (ref 80.0–100.0)
Monocytes Absolute: 0.5 10*3/uL (ref 0.1–1.0)
Monocytes Relative: 9 %
Neutro Abs: 4 10*3/uL (ref 1.7–7.7)
Neutrophils Relative %: 71 %
Platelets: 180 10*3/uL (ref 150–400)
RBC: 4.25 MIL/uL (ref 4.22–5.81)
RDW: 13.1 % (ref 11.5–15.5)
WBC: 5.6 10*3/uL (ref 4.0–10.5)
nRBC: 0 % (ref 0.0–0.2)

## 2022-01-16 LAB — BASIC METABOLIC PANEL
Anion gap: 7 (ref 5–15)
BUN: 13 mg/dL (ref 8–23)
CO2: 30 mmol/L (ref 22–32)
Calcium: 8.3 mg/dL — ABNORMAL LOW (ref 8.9–10.3)
Chloride: 98 mmol/L (ref 98–111)
Creatinine, Ser: 0.93 mg/dL (ref 0.61–1.24)
GFR, Estimated: >60 mL/min (ref >60)
Glucose, Bld: 108 mg/dL — ABNORMAL HIGH (ref 70–99)
Potassium: 4.1 mmol/L (ref 3.5–5.1)
Sodium: 135 mmol/L (ref 135–145)

## 2022-01-16 MED ORDER — FUROSEMIDE 40 MG PO TABS: 40.0000 mg | ORAL_TABLET | Freq: Every day | ORAL | 1 refills | Status: DC

## 2022-01-16 MED ORDER — LACTULOSE 10 GM/15ML PO SOLN: 10.0000 g | Freq: Two times a day (BID) | ORAL | 6 refills | Status: DC

## 2022-01-16 MED ORDER — SPIRONOLACTONE 50 MG PO TABS: 50.0000 mg | ORAL_TABLET | Freq: Two times a day (BID) | ORAL | 1 refills | Status: DC

## 2022-01-16 NOTE — Telephone Encounter (Signed)
Called patient to let him know that

## 2022-01-16 NOTE — Progress Notes (Signed)
[place]  [phone] [phone]  ID: Paul Willis OB: Aug 24, 1955  MR#: [id number]  VQM#:[id number]  Patient Care Team: Dion Body, MD as PCP - General (Family Medicine) Earlie Server, MD as Consulting Physician (Hematology and Oncology) [name], MD as Consulting Physician (Gastroenterology)  CHIEF COMPLAINT: Elevated IgE level.  INTERVAL HISTORY: Patient is a 67 year old male past medical history significant for cirrhosis was evaluated approximately 1 year ago by another physician for elevated IgE levels.  He is referred back for repeat evaluation.  He currently feels well and is at his baseline.  He has no neurologic complaints.  He denies any recent fevers or illnesses.  He has a fair appetite, but denies weight loss.  He has no chest pain, shortness of breath, cough, or hemoptysis.  He denies any nausea, vomiting, constipation, or diarrhea.  He does not complain of abdominal pain today.  He has no urinary complaints.  Patient offers no specific complaints today.  REVIEW OF SYSTEMS:   Review of Systems  Constitutional: Negative.  Negative for fever, malaise/fatigue and weight loss.  Respiratory: Negative.  Negative for cough, hemoptysis and shortness of breath.   Cardiovascular: Negative.  Negative for chest pain and leg swelling.  Gastrointestinal: Negative.  Negative for abdominal pain, diarrhea and nausea.  Genitourinary: Negative.  Negative for dysuria.  Musculoskeletal: Negative.  Negative for back pain.  Skin: Negative.  Negative for rash.  Neurological: Negative.  Negative for dizziness, focal weakness, weakness and headaches.  Psychiatric/Behavioral: Negative.  The patient is not nervous/anxious.     As per HPI. Otherwise, a complete review of systems is negative.  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[place])    Alcoholic cirrhosis of liver with  ascites (H[facility])    Alcoholism ([facility])    Asthma    Elevated IgE level     PAST SURGICAL HISTORY: Past Surgical History:  Procedure Laterality Date   CARDIAC SURGERY     ESOPHAGOGASTRODUODENOSCOPY (EGD) WITH PROPOFOL N/A 01/19/2021   Procedure: ESOPHAGOGASTRODUODENOSCOPY (EGD) WITH PROPOFOL;  Surgeon: [name], MD;  Location: [facility] ENDOSCOPY;  Service: Gastroenterology;  Laterality: N/A;   HERNIA REPAIR Bilateral    age 32    FAMILY HISTORY: Family History  Problem Relation Age of Onset   Lung cancer Mother    Stroke Father    Cancer Maternal Uncle    Cancer Paternal Aunt     ADVANCED DIRECTIVES (Y/N):  N  HEALTH MAINTENANCE: Social History   Tobacco Use   Smoking status: Every Day    Packs/day: 0.50    Types: Cigarettes    Passive exposure: Never   Smokeless tobacco: Never  Vaping Use   Vaping Use: Never used  Substance Use Topics   Alcohol use: Not Currently    Comment: 1 yr sober   Drug use: Never     Colonoscopy:  PAP:  Bone density:  Lipid panel:  Allergies  Allergen Reactions   Albumin (Human) Hives, Itching and Rash    Presumed urticarial reaction.  Responded well to benadryl and steroids. Recommend pre-treatment if albumin given again    No current facility-administered medications for this visit.   No current outpatient medications on file.   Facility-Administered Medications Ordered in Other Visits  Medication Dose Route Frequency Provider Last Rate Last Admin   0.9 %  sodium chloride infusion   Intravenous Continuous [name], MD  OBJECTIVE: Vitals:   01/16/22 1456  BP: 107/69  Pulse: (!) 102  Resp: 18  Temp: (!) 97.5 F (36.4 C)  SpO2: 100%     Body mass index is 22.39 kg/m.    ECOG FS:0 - Asymptomatic  General: Well-developed, well-nourished, no acute distress. Eyes: Pink conjunctiva, anicteric sclera. HEENT: Normocephalic, moist mucous membranes. Lungs: No audible wheezing or coughing. Heart: Regular rate and  rhythm. Abdomen: Soft, nontender, no obvious distention. Musculoskeletal: No edema, cyanosis, or clubbing. Neuro: Alert, answering all questions appropriately. Cranial nerves grossly intact. Skin: No rashes or petechiae noted. Psych: Normal affect. Lymphatics: No cervical, calvicular, axillary or inguinal LAD.   LAB RESULTS:  Lab Results  Component Value Date   NA 135 01/16/2022   K 4.1 01/16/2022   CL 98 01/16/2022   CO2 30 01/16/2022   GLUCOSE 108 (H) 01/16/2022   BUN 13 01/16/2022   CREATININE 0.93 01/16/2022   CALCIUM 8.3 (L) 01/16/2022   PROT 6.0 01/04/2022   ALBUMIN 2.6 (L) 01/04/2022   AST 33 01/04/2022   ALT 20 01/04/2022   ALKPHOS 104 01/04/2022   BILITOT 0.4 01/04/2022   GFRNONAA >60 01/16/2022    Lab Results  Component Value Date   WBC 5.6 01/16/2022   NEUTROABS 4.0 01/16/2022   HGB 12.9 (L) 01/16/2022   HCT 38.3 (L) 01/16/2022   MCV 90.1 01/16/2022   PLT 180 01/16/2022     STUDIES: US ABDOMEN LIMITED WITH LIVER DOPPLER  Result Date: 01/11/2022 CLINICAL DATA:  Alcoholic cirrhosis. Rule out portal vein thrombosis EXAM: DUPLEX ULTRASOUND OF LIVER TECHNIQUE: Color and duplex Doppler ultrasound was performed to evaluate the hepatic in-flow and out-flow vessels. COMPARISON:  CT abdomen and pelvis 11/18/2021 FINDINGS: Liver: Nodular contour with coarsening of hepatic parenchyma and increased echogenicity compatible with cirrhosis. No focal lesion, mass or intrahepatic biliary ductal dilatation. Main Portal Vein size: 0.9 cm Portal Vein Velocities Main Prox:  32.5 cm/sec Main Mid: 30.5 cm/sec Main Dist:  31.5 cm/sec Right: 31.2 cm/sec Left: 19.1 cm/sec Hepatic Vein Velocities Right:  27.2 cm/sec Middle:  23.4 cm/sec Left:  33.9 cm/sec IVC: Present and patent with normal respiratory phasicity. 27.8 cm per second Hepatic Artery Velocity:  64.6 cm/sec Splenic Vein Velocity:  19.7 cm/sec Spleen: 10.6 cm x 5.2 cm x 13.9 cm with a total volume of 395 cm^3 (411 cm^3 is upper  limit normal) Portal Vein Occlusion/Thrombus: No Splenic Vein Occlusion/Thrombus: No Ascites: Moderate to large. Varices: None IMPRESSION: Cirrhosis with moderate to large perihepatic ascites. Hepatopetal flow within the portal vein without portal vein thrombosis. Electronically Signed   By: Placido Sou M.D.   On: 01/11/2022 21:21   US Paracentesis  Result Date: 01/10/2022 INDICATION: Ascites EXAM: ULTRASOUND GUIDED  PARACENTESIS MEDICATIONS: None. COMPLICATIONS: None immediate. PROCEDURE: Informed written consent was obtained from the patient after a discussion of the risks, benefits and alternatives to treatment. A timeout was performed prior to the initiation of the procedure. Initial ultrasound scanning demonstrates a large amount of ascites within the right lower abdominal quadrant. The right lower abdomen was prepped and draped in the usual sterile fashion. 1% lidocaine was used for local anesthesia. Following this, a 19 gauge, 7-cm, Yueh catheter was introduced. An ultrasound image was saved for documentation purposes. The paracentesis was performed. The catheter was removed and a dressing was applied. The patient tolerated the procedure well without immediate post procedural complication. FINDINGS: A total of approximately 5 L of clear, straw-colored peritoneal fluid was removed. IMPRESSION: Successful ultrasound-guided  paracentesis yielding 5 liters of clear ascites. Electronically Signed   By: Albin Felling M.D.   On: 01/10/2022 14:47   US Paracentesis  Result Date: 12/22/2021 INDICATION: Patient with a history of cirrhosis with ascites. Interventional radiology asked to perform a diagnostic and therapeutic paracentesis. EXAM: ULTRASOUND GUIDED PARACENTESIS MEDICATIONS: 1% lidocaine 10 mL COMPLICATIONS: None immediate. PROCEDURE: Informed written consent was obtained from the patient after a discussion of the risks, benefits and alternatives to treatment. A timeout was performed prior to the  initiation of the procedure. Initial ultrasound scanning demonstrates a large amount of ascites within the right lower abdominal quadrant. The right lower abdomen was prepped and draped in the usual sterile fashion. 1% lidocaine was used for local anesthesia. Following this, a 19 gauge, 7-cm, Yueh catheter was introduced. An ultrasound image was saved for documentation purposes. The paracentesis was performed. The catheter was removed and a dressing was applied. The patient tolerated the procedure well without immediate post procedural complication. Patient received post-procedure intravenous albumin; see nursing notes for details. FINDINGS: A total of approximately 12.9 L of clear yellow fluid was removed. Samples were sent to the laboratory as requested by the clinical team. IMPRESSION: Successful ultrasound-guided paracentesis yielding 12.9 liters of peritoneal fluid. Read by: Soyla Dryer, NP PLAN: If the patient eventually requires >/=2 paracenteses in a 30 day period, candidacy for formal evaluation by the [facility] will be assessed. Electronically Signed   By: Albin Felling M.D.   On: 12/22/2021 15:07   DG Chest 2 View  Result Date: 12/21/2021 CLINICAL DATA:  Shortness of breath EXAM: CHEST - 2 VIEW COMPARISON:  11/17/2021 FINDINGS: Cardiac size is within normal limits. There are no signs of pulmonary edema or focal pulmonary consolidation. Linear dense opacity seen in the medial right lower lung field has not changed, possibly scarring. There is no pleural effusion or pneumothorax. Right hemidiaphragm is elevated. IMPRESSION: There are no new infiltrates or signs of pulmonary edema. Electronically Signed   By: Elmer Picker M.D.   On: 12/21/2021 13:27    ASSESSMENT: Elevated IgE level.  PLAN:    Elevated IgE level: Chronic and unchanged.  Patient noted to have a persistently elevated IgE greater than 7700 level since at least December  2022.  This is unlikely related to malignancy or underlying hyper IgE syndrome.  Patient has no significant pathology other than his cirrhosis noted on recent abdominal imaging.  Will get a chest CT for completeness.  Repeat IgE levels are pending at time of dictation. Return to clinic in 3 weeks to discuss the results. Immunoglobulins: Patient noted to have elevated IgG, IgM, and IgA levels.  The clinical significance of this is unclear. Alcoholic cirrhosis: Continue follow-up and treatment per GI.  I spent a total of 30 minutes reviewing chart data, face-to-face evaluation with the patient, counseling and coordination of care as detailed above.   Patient expressed understanding and was in agreement with this plan. He also understands that He can call clinic at any time with any questions, concerns, or complaints.     Lloyd Huger, MD   01/17/2022 8:19 AM

## 2022-01-16 NOTE — Progress Notes (Signed)
Cirhhosis  of Liver. Paracentesis last week removed 5 liters. Has been having on regular basis. Denies pain. Has an appetite. Continues to lose weight despite eating normally.

## 2022-01-17 ENCOUNTER — Ambulatory Visit
Admission: RE | Admit: 2022-01-17 | Discharge: 2022-01-17 | Disposition: A | Discharge status: Home / Self Care | Payer: Medicaid Other | Attending: Gastroenterology | Admitting: Gastroenterology

## 2022-01-17 ENCOUNTER — Ambulatory Visit: Payer: Medicaid Other | Admitting: Anesthesiology

## 2022-01-17 ENCOUNTER — Other Ambulatory Visit: Payer: Self-pay

## 2022-01-17 ENCOUNTER — Encounter: Payer: Self-pay | Admitting: Gastroenterology

## 2022-01-17 ENCOUNTER — Encounter
Admission: RE | Disposition: A | Discharge status: Home / Self Care | Payer: Self-pay | Source: Home / Self Care | Attending: Gastroenterology

## 2022-01-17 DIAGNOSIS — J449 Chronic obstructive pulmonary disease, unspecified: Secondary | ICD-10-CM | POA: Diagnosis not present

## 2022-01-17 DIAGNOSIS — F17210 Nicotine dependence, cigarettes, uncomplicated: Secondary | ICD-10-CM | POA: Diagnosis not present

## 2022-01-17 DIAGNOSIS — I2510 Atherosclerotic heart disease of native coronary artery without angina pectoris: Secondary | ICD-10-CM | POA: Diagnosis not present

## 2022-01-17 DIAGNOSIS — F1020 Alcohol dependence, uncomplicated: Secondary | ICD-10-CM | POA: Diagnosis not present

## 2022-01-17 DIAGNOSIS — I252 Old myocardial infarction: Secondary | ICD-10-CM | POA: Diagnosis not present

## 2022-01-17 DIAGNOSIS — K7030 Alcoholic cirrhosis of liver without ascites: Secondary | ICD-10-CM | POA: Insufficient documentation

## 2022-01-17 DIAGNOSIS — I8500 Esophageal varices without bleeding: Secondary | ICD-10-CM

## 2022-01-17 HISTORY — PX: ESOPHAGOGASTRODUODENOSCOPY (EGD) WITH PROPOFOL: SHX5813

## 2022-01-17 LAB — BASIC METABOLIC PANEL
BUN/Creatinine Ratio: 12 (ref 10–24)
BUN: 12 mg/dL (ref 8–27)
CO2: 30 mmol/L — ABNORMAL HIGH (ref 20–29)
Calcium: 8.5 mg/dL — ABNORMAL LOW (ref 8.6–10.2)
Chloride: 98 mmol/L (ref 96–106)
Creatinine, Ser: 1.03 mg/dL (ref 0.76–1.27)
Glucose: 118 mg/dL — ABNORMAL HIGH (ref 70–99)
Potassium: 4.4 mmol/L (ref 3.5–5.2)
Sodium: 137 mmol/L (ref 134–144)
eGFR: 81 mL/min/{1.73_m2} (ref >59)

## 2022-01-17 LAB — IGG, IGA, IGM
IgA: 665 mg/dL — ABNORMAL HIGH (ref 61–437)
IgG (Immunoglobin G), Serum: 1897 mg/dL — ABNORMAL HIGH (ref 603–1613)
IgM (Immunoglobulin M), Srm: 161 mg/dL (ref 20–172)

## 2022-01-17 LAB — KAPPA/LAMBDA LIGHT CHAINS
Kappa free light chain: 98 mg/L — ABNORMAL HIGH (ref 3.3–19.4)
Kappa, lambda light chain ratio: 1.29 (ref 0.26–1.65)
Lambda free light chains: 75.8 mg/L — ABNORMAL HIGH (ref 5.7–26.3)

## 2022-01-17 SURGERY — ESOPHAGOGASTRODUODENOSCOPY (EGD) WITH PROPOFOL
Anesthesia: General

## 2022-01-17 MED ORDER — DEXMEDETOMIDINE HCL IN NACL 80 MCG/20ML IV SOLN
INTRAVENOUS | Status: DC | PRN
  Administered 2022-01-17: 8 ug via INTRAVENOUS

## 2022-01-17 MED ORDER — PROPOFOL 10 MG/ML IV BOLUS
INTRAVENOUS | Status: DC | PRN
  Administered 2022-01-17: 20 mg via INTRAVENOUS
  Administered 2022-01-17: 80 mg via INTRAVENOUS

## 2022-01-17 MED ORDER — LIDOCAINE HCL (CARDIAC) PF 100 MG/5ML IV SOSY
PREFILLED_SYRINGE | INTRAVENOUS | Status: DC | PRN
  Administered 2022-01-17: 80 mg via INTRAVENOUS

## 2022-01-17 MED ORDER — SODIUM CHLORIDE 0.9 % IV SOLN: INTRAVENOUS | Status: DC

## 2022-01-17 NOTE — Transfer of Care (Signed)
Immediate Anesthesia Transfer of Care Note  Patient: Paul Willis  Procedure(s) Performed: ESOPHAGOGASTRODUODENOSCOPY (EGD) WITH PROPOFOL  Patient Location: PACU  Anesthesia Type:General  Level of Consciousness: awake, alert , and oriented  Airway & Oxygen Therapy: Patient Spontanous Breathing  Post-op Assessment: Report given to RN and Post -op Vital signs reviewed and stable  Post vital signs: Reviewed and stable  Last Vitals:  Vitals Value Taken Time  BP    Temp    Pulse 80 01/17/22 0846  Resp 22 01/17/22 0846  SpO2 98 % 01/17/22 0846  Vitals shown include unvalidated device data.  Last Pain:  Vitals:   01/17/22 0808  TempSrc: Temporal  PainSc: 0-No pain         Complications: No notable events documented.

## 2022-01-17 NOTE — Anesthesia Postprocedure Evaluation (Signed)
Anesthesia Post Note  Patient: Jahrell Hamor  Procedure(s) Performed: ESOPHAGOGASTRODUODENOSCOPY (EGD) WITH PROPOFOL  Patient location during evaluation: PACU Anesthesia Type: General Level of consciousness: awake Pain management: pain level controlled Vital Signs Assessment: post-procedure vital signs reviewed and stable Respiratory status: spontaneous breathing and nonlabored ventilation Cardiovascular status: stable Anesthetic complications: no  No notable events documented.   Last Vitals:  Vitals:   01/17/22 0858 01/17/22 0902  BP:  (!) 87/60  Pulse: 74 77  Resp: 17 15  Temp:    SpO2: 97% 99%    Last Pain:  Vitals:   01/17/22 0850  TempSrc: Temporal  PainSc:                  VAN STAVEREN,Nona Gracey

## 2022-01-17 NOTE — Anesthesia Preprocedure Evaluation (Signed)
Anesthesia Evaluation  Patient identified by MRN, date of birth, ID band Patient awake    Reviewed: Allergy & Precautions, NPO status , Patient's Chart, lab work & pertinent test results  Airway Mallampati: II  TM Distance: >3 FB Neck ROM: full    Dental  (+) Upper Dentures, Lower Dentures   Pulmonary neg pulmonary ROS, asthma , COPD, Current Smoker and Patient abstained from smoking.   Pulmonary exam normal  + decreased breath sounds      Cardiovascular Exercise Tolerance: Good + CAD and + Past MI  negative cardio ROS Normal cardiovascular exam Rhythm:Regular Rate:Normal     Neuro/Psych negative neurological ROS  negative psych ROS   GI/Hepatic negative GI ROS, Neg liver ROS,,,(+) Cirrhosis   Esophageal Varices  substance abuse  alcohol use  Endo/Other  negative endocrine ROS    Renal/GU negative Renal ROS  negative genitourinary   Musculoskeletal   Abdominal Normal abdominal exam  (+)   Peds negative pediatric ROS (+)  Hematology negative hematology ROS (+)   Anesthesia Other Findings Past Medical History: No date: Acute MI ([place]) No date: Alcohol-induced polyneuropathy ([facility]) No date: Alcoholic cirrhosis of liver with ascites (HCC) No date: Alcoholism ([place]) No date: Asthma No date: Elevated IgE level  Past Surgical History: No date: CARDIAC SURGERY 01/19/2021: ESOPHAGOGASTRODUODENOSCOPY (EGD) WITH PROPOFOL; N/A     Comment:  Procedure: ESOPHAGOGASTRODUODENOSCOPY (EGD) WITH               PROPOFOL;  Surgeon: [name], MD;  Location: [facility]               ENDOSCOPY;  Service: Gastroenterology;  Laterality: N/A; No date: HERNIA REPAIR; Bilateral     Comment:  age 67     Reproductive/Obstetrics negative OB ROS                             Anesthesia Physical Anesthesia Plan  ASA: 3  Anesthesia Plan: General   Post-op Pain Management:    Induction: Intravenous  PONV Risk  Score and Plan: Propofol infusion and TIVA  Airway Management Planned: Natural Airway  Additional Equipment:   Intra-op Plan:   Post-operative Plan:   Informed Consent: I have reviewed the patients History and Physical, chart, labs and discussed the procedure including the risks, benefits and alternatives for the proposed anesthesia with the patient or authorized representative who has indicated his/her understanding and acceptance.     Dental Advisory Given  Plan Discussed with: CRNA and Surgeon  Anesthesia Plan Comments:        Anesthesia Quick Evaluation

## 2022-01-17 NOTE — Progress Notes (Signed)
BMP - stable continue at same dose , monitor weights and if drops too much or rises inform us- repeat BMP in 4 weeks.  Can you confirm what dose is he taken prior to bmp

## 2022-01-17 NOTE — H&P (Signed)
[name], MD [address], [place], [place], [place], [address] [address], [place], [place], [place], [address] Phone: [phone]  Fax: [phone]  Primary Care Physician:  Dion Body, MD   Pre-Procedure History & Physical: HPI:  Paul Willis is a 67 y.o. 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 Authorizing Provider  lactulose (CHRONULAC) 10 GM/15ML solution Take 15 mLs (10 g total) by mouth 2 (two) times daily. 01/16/22  Yes [name], MD  spironolactone (ALDACTONE) 50 MG tablet Take 1 tablet (50 mg total) by mouth 2 (two) times daily. 01/16/22  Yes [name], MD  folic acid (FOLVITE) 1 MG tablet Take 1 tablet (1 mg total) by mouth daily. 11/21/21   Sidney Ace, MD  furosemide (LASIX) 40 MG tablet Take 1 tablet (40 mg total) by mouth daily. 01/16/22 01/16/23  [name], MD  Ipratropium-Albuterol (COMBIVENT) 20-100 MCG/ACT AERS respimat Inhale 1 puff into the lungs every 6 (six) hours as needed for wheezing or shortness of breath. 12/22/21   Jennye Boroughs, MD    Allergies as of 01/12/2022 - Review Complete 01/04/2022  Allergen Reaction Noted   Albumin (human) Hives, Itching, and Rash 11/21/2021    Family History  Problem Relation Age of Onset   Lung cancer Mother    Stroke Father    Cancer Maternal Uncle    Cancer Paternal Aunt     Social  History   Socioeconomic History   Marital status: Single    Spouse name: Not on file   Number of children: Not on file   Years of education: Not on file   Highest education level: Not on file  Occupational History   Not on file  Tobacco Use   Smoking status: Every Day    Packs/day: 0.50    Types: Cigarettes    Passive exposure: Never   Smokeless tobacco: Never  Vaping Use   Vaping Use: Never used  Substance and Sexual Activity   Alcohol use: Not Currently    Comment: 1 yr sober   Drug use: Never   Sexual activity: Not on file  Other Topics Concern   Not on file  Social History Narrative   Not on file   Social Determinants of Health   Financial Resource Strain: Not on file  Food Insecurity: No Food Insecurity (11/17/2021)   Hunger Vital Sign    Worried About Running Out of Food in the Last Year: Never true    Ran Out of Food in the Last Year: Never true  Transportation Needs: No Transportation Needs (11/17/2021)   PRAPARE - Hydrologist (Medical): No    Lack of Transportation (Non-Medical): No  Physical Activity: Not on file  Stress: Not on file  Social Connections: Not on file  Intimate  Partner Violence: Not At Risk (11/17/2021)   Humiliation, Afraid, Rape, and Kick questionnaire    Fear of Current or Ex-Partner: No    Emotionally Abused: No    Physically Abused: No    Sexually Abused: No    Review of Systems: See HPI, otherwise negative ROS  Physical Exam: BP 116/73   Pulse 84   Temp 98.3 F (36.8 C) (Temporal)   Resp 16   Ht 5\' 9"  (1.753 m)   Wt 68.5 kg   SpO2 100%   BMI 22.30 kg/m  General:   Alert,  pleasant and cooperative in NAD Head:  Normocephalic and atraumatic. Neck:  Supple; no masses or thyromegaly. Lungs:  Clear throughout to auscultation, normal respiratory effort.    Heart:  +S1, +S2, Regular rate and rhythm, No edema. Abdomen:  Soft, nontender and nondistended. Normal bowel sounds, without guarding, and  without rebound.   Neurologic:  Alert and  oriented x4;  grossly normal neurologically.  Impression/Plan: Paul Willis is here for an endoscopy  to be performed for  evaluation of esophageal varices    Risks, benefits, limitations, and alternatives regarding endoscopy have been reviewed with the patient.  Questions have been answered.  All parties agreeable.   [name], MD  01/17/2022, 8:33 AM

## 2022-01-17 NOTE — Op Note (Signed)
[facility] Patient Name: Paul Willis Procedure Date: 01/17/2022 8:35 AM MRN: [id number] Account #: [id number] Date of Birth: 05/20/1955 Admit Type: Outpatient Age: 67 Room: [facility] ENDO ROOM 4 Gender: Male Note Status: Finalized Instrument Name: Upper Endoscope [id number] Procedure:             Upper GI endoscopy Indications:           Cirrhosis rule out esophageal varices Providers:             [name] MD, MD Medicines:             Monitored Anesthesia Care Complications:         No immediate complications. Procedure:             Pre-Anesthesia Assessment:                        - Prior to the procedure, a History and Physical was                         performed, and patient medications, allergies and                         sensitivities were reviewed. The patient's tolerance                         of previous anesthesia was reviewed.                        - The risks and benefits of the procedure and the                         sedation options and risks were discussed with the                         patient. All questions were answered and informed                         consent was obtained.                        - ASA Grade Assessment: III - A patient with severe                         systemic disease.                        After obtaining informed consent, the endoscope was                         passed under direct vision. Throughout the procedure,                         the patient's blood pressure, pulse, and oxygen                         saturations were monitored continuously. The Endoscope                         was introduced through the mouth, and advanced to the  third part of duodenum. The upper GI endoscopy was                         accomplished with ease. The patient tolerated the                         procedure well. Findings:      The esophagus was normal.      The stomach was  normal.      The examined duodenum was normal. Impression:            - Normal esophagus.                        - Normal stomach.                        - Normal examined duodenum.                        - No specimens collected. Recommendation:        - Discharge patient to home (with escort).                        - Resume previous diet.                        - Continue present medications.                        - Repeat upper endoscopy in 3 years for surveillance.                        - Return to my office as previously scheduled. Procedure Code(s):     --- Professional ---                        [phone], Esophagogastroduodenoscopy, flexible,                         transoral; diagnostic, including collection of                         specimen(s) by brushing or washing, when performed                         (separate procedure) Diagnosis Code(s):     --- Professional ---                        K74.60, Unspecified cirrhosis of liver CPT copyright 2022 American Medical Association. All rights reserved. The codes documented in this report are preliminary and upon coder review may  be revised to meet current compliance requirements. [name], MD [name] MD, MD 01/17/2022 8:42:35 AM This report has been signed electronically. Number of Addenda: 0 Note Initiated On: 01/17/2022 8:35 AM Estimated Blood Loss:  Estimated blood loss: none.      [facility]

## 2022-01-18 ENCOUNTER — Telehealth: Payer: Self-pay

## 2022-01-18 ENCOUNTER — Other Ambulatory Visit: Payer: Self-pay

## 2022-01-18 ENCOUNTER — Encounter: Payer: Self-pay | Admitting: Gastroenterology

## 2022-01-18 LAB — PROTEIN ELECTROPHORESIS, SERUM
A/G Ratio: 0.6 — ABNORMAL LOW (ref 0.7–1.7)
Albumin ELP: 2.4 g/dL — ABNORMAL LOW (ref 2.9–4.4)
Alpha-1-Globulin: 0.3 g/dL (ref 0.0–0.4)
Alpha-2-Globulin: 0.6 g/dL (ref 0.4–1.0)
Beta Globulin: 0.9 g/dL (ref 0.7–1.3)
Gamma Globulin: 2 g/dL — ABNORMAL HIGH (ref 0.4–1.8)
Globulin, Total: 3.7 g/dL (ref 2.2–3.9)
M-Spike, %: 0.3 g/dL — ABNORMAL HIGH
Total Protein ELP: 6.1 g/dL (ref 6.0–8.5)

## 2022-01-18 MED ORDER — LACTULOSE 10 GM/15ML PO SOLN: 10.0000 g | Freq: Two times a day (BID) | ORAL | 10 refills | Status: DC

## 2022-01-18 NOTE — Telephone Encounter (Signed)
Yes refill

## 2022-01-18 NOTE — Telephone Encounter (Signed)
-----   Message from [name], MD sent at 01/17/2022  8:47 AM EST ----- BMP - stable continue at same dose , monitor weights and if drops too much or rises inform us- repeat BMP in 4 weeks.  Can you confirm what dose is he taken prior to bmp

## 2022-01-18 NOTE — Telephone Encounter (Addendum)
Mr. Demirjian is currently taking lactulose 15 grams BID, Furesomide 40 MG once a day, and spironolactone 50 mg BID. He wants to know if you will refill his folic acid or no need to take?

## 2022-01-19 ENCOUNTER — Other Ambulatory Visit: Payer: Self-pay

## 2022-01-19 LAB — IGE: IgE (Immunoglobulin E), Serum: 7875 IU/mL — ABNORMAL HIGH (ref 6–495)

## 2022-01-19 MED ORDER — FOLIC ACID 1 MG PO TABS: 1.0000 mg | ORAL_TABLET | Freq: Every day | ORAL | 2 refills | Status: DC

## 2022-01-19 NOTE — Telephone Encounter (Signed)
Called patient to let him know that Dr. Vicente Males agreed on refilling his Folic acid and that I sent it to his pharmacy to be picked up. Patient agreed and had no further questions.

## 2022-01-25 ENCOUNTER — Encounter: Payer: Self-pay | Admitting: Gastroenterology

## 2022-01-25 ENCOUNTER — Ambulatory Visit (INDEPENDENT_AMBULATORY_CARE_PROVIDER_SITE_OTHER): Payer: Medicaid Other | Admitting: Gastroenterology

## 2022-01-25 VITALS — BP 114/41 | HR 94 | Temp 98.1°F | Wt 150.0 lb

## 2022-01-25 DIAGNOSIS — Z23 Encounter for immunization: Secondary | ICD-10-CM

## 2022-01-25 DIAGNOSIS — K7031 Alcoholic cirrhosis of liver with ascites: Secondary | ICD-10-CM

## 2022-01-25 MED ORDER — EPLERENONE 25 MG PO TABS: 50.0000 mg | ORAL_TABLET | Freq: Two times a day (BID) | ORAL | 1 refills | Status: DC

## 2022-01-25 NOTE — Patient Instructions (Signed)
Please stop taking Spironolactone and start taking

## 2022-01-25 NOTE — Progress Notes (Signed)
[name] MD, MRCP(U.K) [address]  [place]  [place], [place] [address]  Main: [phone]  Fax: [phone]   Primary Care Physician: Dion Body, MD  Primary Gastroenterologist:  Dr. [name]   Chief Complaint  Patient presents with   Cirrhosis    HPI: Paul Willis is a 67 y.o. male  Summary of history : Initially referred and seen on 12/15/2020 for cirrhosis of the liver.  Admitted in 10/04/2020 with shortness of breath.  Found to have cirrhosis of the liver with ascites, gynecomastia it was found on imaging.  History of excess alcohol consumption for many years but he quit in September 2022 no other high risk behaviors.   01/19/2021: EGD: Moderate portal hypertensive gastropathy noted no esophageal varices 12/16/2020: Ultrasound ascites shows no intra-abdominal ascites   12/15/2020: INR 0.9 not immune to hepatitis a and B request vaccination negative hepatitis C.  Autoimmune work-up was negative IgE levels were 8200.  Serology was negative.     12/15/2020 not immune to hepatitis A and B otherwise autoimmune panel and testing for viral hepatitis negative   12/04/2021 presented to the hospital with abdominal distention found to have ascites 8 L taken off commenced on Lasix and Aldactone and discharged home On 40 mg Lasix and 50 mg Aldactone   12/22/2021 INR 1.1, creatinine 0.99, fluid cell count showed no evidence of SBP in November 20, 2021 acetic fluid suggestive of portal hypertension related fluid.  11/18/2021 CT abdomen pelvis shows large volume ascites no liver mass seen.  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 30 tablet 2   furosemide (LASIX) 40 MG tablet Take 1 tablet (40 mg total) by mouth daily. 30 tablet 1   Ipratropium-Albuterol (COMBIVENT) 20-100 MCG/ACT AERS respimat Inhale 1 puff into the lungs every 6 (six) hours as needed for wheezing or shortness of breath. 1 each 0   lactulose (CHRONULAC) 10 GM/15ML solution Take 15 mLs (10 g total) by mouth 2 (two) times daily. 946 mL 10   spironolactone (ALDACTONE) 50 MG tablet Take 1 tablet (50 mg total) by mouth 2 (two) times daily. 60 tablet 1   No current facility-administered medications for this visit.    Allergies as of 01/25/2022 - Review Complete 01/17/2022  Allergen Reaction Noted   Albumin (human) Hives, Itching, and Rash 11/21/2021    ROS:  General: Negative for anorexia, weight loss, fever, chills, fatigue, weakness. ENT: Negative for hoarseness, difficulty swallowing , nasal congestion. CV: Negative for chest pain, angina, palpitations, dyspnea on exertion, peripheral edema.  Respiratory: Negative for dyspnea at rest, dyspnea on exertion, cough, sputum, wheezing.  GI: See history of present  illness. GU:  Negative for dysuria, hematuria, urinary incontinence, urinary frequency, nocturnal urination.  Endo: Negative for unusual weight change.    Physical Examination:   BP (!) 114/41   Pulse 94   Temp 98.1 F (36.7 C) (Oral)   Wt 150 lb (68 kg)   BMI 22.15 kg/m   General: Well-nourished, well-developed in no acute  distress.  Eyes: No icterus. Conjunctivae pink. Mouth: Oropharyngeal mucosa moist and pink , no lesions erythema or exudate. Abdomen: Distended fluid thrill present tense ascites present no guarding rigidity Extremities: Sarcopenia loss of muscle mass Neuro: Alert and oriented x 3.  Grossly intact. Skin: Warm and dry, no jaundice.   Psych: Alert and cooperative, normal mood and affect.   Imaging Studies: US ABDOMEN LIMITED WITH LIVER DOPPLER  Result Date: 01/11/2022 CLINICAL DATA:  Alcoholic cirrhosis. Rule out portal vein thrombosis EXAM: DUPLEX ULTRASOUND OF LIVER TECHNIQUE: Color and duplex Doppler ultrasound was performed to evaluate the hepatic in-flow and out-flow vessels. COMPARISON:  CT abdomen and pelvis 11/18/2021 FINDINGS: Liver: Nodular contour with coarsening of hepatic parenchyma and increased echogenicity compatible with cirrhosis. No focal lesion, mass or intrahepatic biliary ductal dilatation. Main Portal Vein size: 0.9 cm Portal Vein Velocities Main Prox:  32.5 cm/sec Main Mid: 30.5 cm/sec Main Dist:  31.5 cm/sec Right: 31.2 cm/sec Left: 19.1 cm/sec Hepatic Vein Velocities Right:  27.2 cm/sec Middle:  23.4 cm/sec Left:  33.9 cm/sec IVC: Present and patent with normal respiratory phasicity. 27.8 cm per second Hepatic Artery Velocity:  64.6 cm/sec Splenic Vein Velocity:  19.7 cm/sec Spleen: 10.6 cm x 5.2 cm x 13.9 cm with a total volume of 395 cm^3 (411 cm^3 is upper limit normal) Portal Vein Occlusion/Thrombus: No Splenic Vein Occlusion/Thrombus: No Ascites: Moderate to large. Varices: None IMPRESSION: Cirrhosis with moderate to large perihepatic ascites. Hepatopetal flow within the portal vein without portal vein thrombosis. Electronically Signed   By: Placido Sou M.D.   On: 01/11/2022 21:21   US Paracentesis  Result Date: 01/10/2022 INDICATION: Ascites EXAM: ULTRASOUND GUIDED  PARACENTESIS MEDICATIONS: None. COMPLICATIONS: None immediate. PROCEDURE: Informed written consent  was obtained from the patient after a discussion of the risks, benefits and alternatives to treatment. A timeout was performed prior to the initiation of the procedure. Initial ultrasound scanning demonstrates a large amount of ascites within the right lower abdominal quadrant. The right lower abdomen was prepped and draped in the usual sterile fashion. 1% lidocaine was used for local anesthesia. Following this, a 19 gauge, 7-cm, Yueh catheter was introduced. An ultrasound image was saved for documentation purposes. The paracentesis was performed. The catheter was removed and a dressing was applied. The patient tolerated the procedure well without immediate post procedural complication. FINDINGS: A total of approximately 5 L of clear, straw-colored peritoneal fluid was removed. IMPRESSION: Successful ultrasound-guided paracentesis yielding 5 liters of clear ascites. Electronically Signed   By: Albin Felling M.D.   On: 01/10/2022 14:47    Assessment and Plan:   Paul Willis is a 67 y.o. y/o male  here to follow-up for alcoholic liver cirrhosis with ascites initially which has resolved after stopping alcohol, gynecomastia incidentally found on imaging.  Presently no hepatic encephalopathy.  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  recent decompensation in December 2023 large quantity of ascites drained.  Presently his weight has been stable but he does have tense ascites.  Has developed right breast gynecomastia which is enlarged I will change his spironolactone to eplerenone  Plan 1.  Low-salt diet stop Aldactone commence on eplerenone 50 mg twice daily, BMP in 1 week with weight check based on that we will increase the dose further 2.  Stop all alcohol, large-volume paracentesis 3.  EGD to screen for esophageal varices in 2027 4.  Right upper quadrant ultrasound in May 2024, Doppler ultrasound of the liver to rule out portal vein thrombosis 5.  Elevated IgE follows with Dr. Orlie Dakin  awaiting CT chest abdomen pelvis 6.  Refer to [facility] for liver transplant evaluation 7.  Will watch him closely at next visit if he continues to gain weight, may need a TIPS procedure.    Dr Wyline Mood  MD,MRCP [facility]) Follow up in 3 weeks

## 2022-01-31 NOTE — Addendum Note (Signed)
Addended by: Wayna Chalet on: 01/31/2022 05:06 PM   Modules accepted: Orders

## 2022-02-02 ENCOUNTER — Ambulatory Visit (INDEPENDENT_AMBULATORY_CARE_PROVIDER_SITE_OTHER): Payer: Self-pay | Admitting: Gastroenterology

## 2022-02-02 VITALS — Wt 154.0 lb

## 2022-02-02 DIAGNOSIS — Z0130 Encounter for examination of blood pressure without abnormal findings: Secondary | ICD-10-CM

## 2022-02-02 NOTE — Progress Notes (Signed)
Patient came in to the office to get his weight checked and for blood work. I explained to him that we did not have a phlebotomist in the office on Friday's. However, I told him that he could go to [facility] and have his labs drawn with them. Patient stated that he will just show up on Monday in the afternoon to have them drawn. Patient stated that he was feeling well with the dosage that was prescribed to him so he was shocked to see that he was 4 lbs heavier then 7 days ago.

## 2022-02-05 ENCOUNTER — Ambulatory Visit
Admission: RE | Admit: 2022-02-05 | Discharge: 2022-02-05 | Disposition: A | Discharge status: Home / Self Care | Payer: Medicaid Other | Source: Ambulatory Visit | Attending: Oncology | Admitting: Oncology

## 2022-02-05 DIAGNOSIS — R768 Other specified abnormal immunological findings in serum: Secondary | ICD-10-CM | POA: Diagnosis not present

## 2022-02-05 MED ORDER — IOHEXOL 300 MG/ML  SOLN
75.0000 mL | Freq: Once | INTRAMUSCULAR | Status: AC | PRN
  Administered 2022-02-05: 75 mL via INTRAVENOUS

## 2022-02-05 MED ORDER — IOHEXOL 300 MG/ML  SOLN: 75.0000 mL | Freq: Once | INTRAMUSCULAR | Status: DC | PRN

## 2022-02-07 ENCOUNTER — Inpatient Hospital Stay: Payer: Medicaid Other | Admitting: Oncology

## 2022-02-19 ENCOUNTER — Ambulatory Visit
Admission: RE | Admit: 2022-02-19 | Discharge: 2022-02-19 | Disposition: A | Discharge status: Home / Self Care | Payer: Medicaid Other | Source: Ambulatory Visit | Attending: Family Medicine | Admitting: Family Medicine

## 2022-02-19 ENCOUNTER — Ambulatory Visit (INDEPENDENT_AMBULATORY_CARE_PROVIDER_SITE_OTHER): Payer: Medicaid Other | Admitting: Gastroenterology

## 2022-02-19 ENCOUNTER — Encounter: Payer: Self-pay | Admitting: Gastroenterology

## 2022-02-19 VITALS — BP 115/75 | HR 72 | Temp 98.3°F | Wt 150.2 lb

## 2022-02-19 DIAGNOSIS — K7031 Alcoholic cirrhosis of liver with ascites: Secondary | ICD-10-CM | POA: Insufficient documentation

## 2022-02-19 DIAGNOSIS — R768 Other specified abnormal immunological findings in serum: Secondary | ICD-10-CM | POA: Diagnosis present

## 2022-02-19 MED ORDER — LIDOCAINE HCL (PF) 1 % IJ SOLN
10.0000 mL | Freq: Once | INTRAMUSCULAR | Status: AC
  Administered 2022-02-19: 10 mL via INTRADERMAL
  Filled 2022-02-19: qty 10

## 2022-02-19 NOTE — Patient Instructions (Addendum)
Please come back in 2 weeks to get your weight, blood pressure and labs checked.  We will call you to schedule your 4 month follow up once we have our schedule for June 2024.

## 2022-02-19 NOTE — Procedures (Signed)
PROCEDURE SUMMARY:  Successful ultrasound guided therapeutic paracentesis from the RLQ yielded 4.2 L of clear, yellow fluid.  No immediate complications.  The patient tolerated the procedure well.   Specimen not sent for labs.  EBL < 1 mL  If the patient eventually requires >/=2 paracenteses in a 30 day period, screening evaluation by the [facility] will be assessed.    Narda Rutherford, AGNP-BC 02/19/2022, 3:54 PM

## 2022-02-19 NOTE — Progress Notes (Signed)
Paul Bellows MD, MRCP(U.K) [address]  [place]  [place], [place] [address]  Main: [phone]  Fax: [phone]   Primary Care Physician: Dion Body, MD  Primary Gastroenterologist:  Dr. Jonathon Willis   Chief Complaint  Patient presents with   Cirrhosis    HPI: Paul Willis is a 67 y.o. male Summary of history : Initially referred and seen on 12/15/2020 for cirrhosis of the liver.  Admitted in 10/04/2020 with shortness of breath.  Found to have cirrhosis of the liver with ascites, gynecomastia it was found on imaging.  History of excess alcohol consumption for many years but he quit in September 2022 no other high risk behaviors.   01/19/2021: EGD: Moderate portal hypertensive gastropathy noted no esophageal varices 12/16/2020: Ultrasound ascites shows no intra-abdominal ascites   12/15/2020: INR 0.9 not immune to hepatitis a and B request vaccination negative hepatitis C.  Autoimmune work-up was negative IgE levels were 8200.  Serology was negative.     12/15/2020 not immune to hepatitis A and B otherwise autoimmune panel and testing for viral hepatitis negative   12/04/2021 presented to the hospital with abdominal distention found to have ascites 8 L taken off commenced on Lasix and Aldactone and discharged home On 40 mg Lasix and 50 mg Aldactone   12/22/2021 INR 1.1, creatinine 0.99, fluid cell count showed no evidence of SBP in November 20, 2021 acetic fluid suggestive of portal hypertension related fluid.  11/18/2021 CT abdomen pelvis shows large volume ascites no liver mass seen. 01/11/2022 ultrasound with Doppler shows cirrhosis with moderate to large perihepatic ascites no evidence of portal vein thrombosis 01/17/2022: EGD: No evidence of esophageal varices 01/16/2022: Potassium 4.1 creatinine 0.93, hemoglobin 12.9 g AFP 4.2 in December 2023 albumin 2.6 total bilirubin 0.4    Interval history 01/25/2022-02/19/2022    Taking lactulose 15 twice daily, Lasix 40 mg once a  day and Aldactone 50 mg twice daily He follows with Dr. Grayland Ormond for elevated IgE Weight is stable at 150 pounds   He has right breast gynecomastia   Denies any NSAID use, sleeping well at night having regular bowel movements Current Outpatient Medications  Medication Sig Dispense Refill   eplerenone (INSPRA) 25 MG tablet Take 2 tablets (50 mg total) by mouth 2 (two) times daily. 536 tablet 1   folic acid (FOLVITE) 1 MG tablet Take 1 tablet (1 mg total) by mouth daily. 30 tablet 2   furosemide (LASIX) 40 MG tablet Take 1 tablet (40 mg total) by mouth daily. 30 tablet 1   lactulose (CHRONULAC) 10 GM/15ML solution Take 15 mLs (10 g total) by mouth 2 (two) times daily. 946 mL 10   Ipratropium-Albuterol (COMBIVENT) 20-100 MCG/ACT AERS respimat Inhale 1 puff into the lungs every 6 (six) hours as needed for wheezing or shortness of breath. (Patient not taking: Reported on 02/19/2022) 1 each 0   No current facility-administered medications for this visit.    Allergies as of 02/19/2022 - Review Complete 02/19/2022  Allergen Reaction Noted   Albumin (human) Hives, Itching, and Rash 11/21/2021    ROS:  General: Negative for anorexia, weight loss, fever, chills, fatigue, weakness. ENT: Negative for hoarseness, difficulty swallowing , nasal congestion. CV: Negative for chest pain, angina, palpitations, dyspnea on exertion, peripheral edema.  Respiratory: Negative for dyspnea at rest, dyspnea on exertion, cough, sputum, wheezing.  GI: See history of present illness. GU:  Negative for dysuria, hematuria, urinary incontinence, urinary frequency, nocturnal urination.  Endo: Negative for unusual  weight change.    Physical Examination:   BP 115/75   Pulse 72   Temp 98.3 F (36.8 C) (Oral)   Wt 150 lb 3.2 oz (68.1 kg)   BMI 22.18 kg/m   General: Well-nourished, well-developed in no acute distress.  Eyes: No icterus. Conjunctivae pink. Mouth: Oropharyngeal mucosa moist and pink , no lesions  erythema or exudate. Abdomen: Bowel sounds are normal, nontender, fluid thrill moderate ascites, no hepatosplenomegaly or masses, no abdominal bruits or hernia , no rebound or guarding.   Extremities: No lower extremity edema. No clubbing or deformities. Neuro: Alert and oriented x 3.  Grossly intact. Skin: Warm and dry, no jaundice.   Psych: Alert and cooperative, normal mood and affect.   Imaging Studies: CT Chest W Contrast  Result Date: 02/05/2022 CLINICAL DATA:  Elevated IgE level. * Tracking Code: BO *. Concern for lymphoma. EXAM: CT CHEST WITH CONTRAST TECHNIQUE: Multidetector CT imaging of the chest was performed during intravenous contrast administration. RADIATION DOSE REDUCTION: This exam was performed according to the departmental dose-optimization program which includes automated exposure control, adjustment of the mA and/or kV according to patient size and/or use of iterative reconstruction technique. CONTRAST:  4mL OMNIPAQUE IOHEXOL 300 MG/ML  SOLN COMPARISON:  CT abdomen 11/18/2021 FINDINGS: Cardiovascular: Coronary artery calcification and aortic atherosclerotic calcification. Mediastinum/Nodes: No axillary or supraclavicular adenopathy. No mediastinal or hilar adenopathy. No pericardial fluid. Esophagus normal. Lungs/Pleura: Band mild benign appearing atelectasis in the RIGHT lower lobe. Upper Abdomen: Large volume of ascites similar to comparison CT. Gallstones noted. Musculoskeletal: Bilateral gynecomastia. IMPRESSION: 1. No lymphadenopathy in the thorax suggest lymphoma. 2. Large volume peritoneal ascites similar comparison exam. 3. Bilateral gynecomastia. Electronically Signed   By: Suzy Bouchard M.D.   On: 02/05/2022 11:22    Assessment and Plan:   Paul Willis is a 67 y.o. y/o malehere to follow-up for alcoholic liver cirrhosis with ascites initially which has resolved after stopping alcohol, gynecomastia incidentally found on imaging.  Presently no hepatic encephalopathy.   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  recent decompensation in December 2023 large quantity of ascites drained.  Presently his weight has been stable but he does have tense ascites.  Has developed right breast gynecomastia which is enlarged I will change his spironolactone to eplerenone     Plan 1.   eplerenone 50 mg a day, Lasix milligrams a day recheck body weight in 2 weeks 2.  Stop all alcohol  3.  EGD to screen for esophageal varices in 2027 4.  Right upper quadrant ultrasound in May 2024, 5.  Elevated IgE follows with Dr. Grayland Ormond awaiting CT chest abdomen pelvis 6.  [facility] for liver transplant evaluation 7.  Will watch him closely at next visit if he continues to gain weight, may need a TIPS procedure.   8.  Recheck body weight in 2 weeks BMP in 3 weeks following which we will titrate his Lasix further     Dr Paul Bellows  MD,MRCP [facility]) Follow up in 4 months

## 2022-02-20 ENCOUNTER — Telehealth: Payer: Self-pay

## 2022-02-20 MED ORDER — EPLERENONE 25 MG PO TABS: ORAL_TABLET | ORAL | 3 refills | Status: DC

## 2022-02-20 MED ORDER — FUROSEMIDE 40 MG PO TABS: 40.0000 mg | ORAL_TABLET | Freq: Every day | ORAL | 1 refills | Status: DC

## 2022-02-20 NOTE — Telephone Encounter (Signed)
Called patient to let him know that Dr. Vicente Males is recommending for him to continue taking his Lasix 40 MG daily and increase his eplerenone to 50 MG in the AM and 75 MG in the PM. He also recommended for him to give [place] daily weights in 10 days. Patient was also informed to come to our office and have labs drwan in 5 days. Patient agreed with everything and had no further questions.

## 2022-02-20 NOTE — Telephone Encounter (Signed)
-----   Message from [name], MD sent at 02/20/2022  8:07 AM EST ----- Herb Grays he had a lot of ascites  1. Increase dose of eplerenone to 50 mg in am and 75 at night  2. Continue lasix at 40 mg and check BMP in 5 days 3. Daily weights and he should send a chart in 10 days

## 2022-02-20 NOTE — Progress Notes (Signed)
Herb Grays he had a lot of ascites  1. Increase dose of eplerenone to 50 mg in am and 75 at night  2. Continue lasix at 40 mg and check BMP in 5 days 3. Daily weights and he should send a chart in 10 days

## 2022-03-05 ENCOUNTER — Ambulatory Visit: Payer: Medicaid Other

## 2022-03-07 ENCOUNTER — Ambulatory Visit: Payer: Medicaid Other

## 2022-03-21 ENCOUNTER — Other Ambulatory Visit: Payer: Self-pay | Admitting: Gastroenterology

## 2022-03-21 ENCOUNTER — Ambulatory Visit
Admission: RE | Admit: 2022-03-21 | Discharge: 2022-03-21 | Disposition: A | Discharge status: Home / Self Care | Payer: Medicaid Other | Source: Ambulatory Visit | Attending: Family Medicine | Admitting: Family Medicine

## 2022-03-21 DIAGNOSIS — R768 Other specified abnormal immunological findings in serum: Secondary | ICD-10-CM

## 2022-03-21 DIAGNOSIS — K7031 Alcoholic cirrhosis of liver with ascites: Secondary | ICD-10-CM | POA: Insufficient documentation

## 2022-03-26 ENCOUNTER — Other Ambulatory Visit: Payer: Self-pay | Admitting: Gastroenterology

## 2022-03-29 ENCOUNTER — Other Ambulatory Visit: Payer: Self-pay

## 2022-03-29 MED ORDER — EPLERENONE 25 MG PO TABS: ORAL_TABLET | ORAL | 3 refills | Status: DC

## 2022-04-17 NOTE — Progress Notes (Signed)
Ultrasound shows no ascites he was supposed to have his labs drawn as well as give [place] an update on his weight can we get it from him please

## 2022-04-18 ENCOUNTER — Telehealth: Payer: Self-pay

## 2022-04-18 NOTE — Telephone Encounter (Signed)
Called patient back to let him know about his ultrasound results and not having ascites at the moment. I also reminded him that Dr. Vicente Males wanted him to have labs drawn in February and he never had it drawn. He stated that he would come in and have it drawn. Patient also stated that his current weight is 148 lb and that last time he had a paracentesis schedule he did not needed since his abdomen has not been retaining fluid thanks to his medications. Patient has no further questions.

## 2022-04-18 NOTE — Telephone Encounter (Signed)
-----   Message from Paul Bellows, MD sent at 04/17/2022  1:55 PM EDT ----- Ultrasound shows no ascites he was supposed to have his labs drawn as well as give [place] an update on his weight can we get it from him please

## 2022-04-19 NOTE — Telephone Encounter (Signed)
Patient came in to the office and dropped off his weight list. This will be scanned in his chart under media.

## 2022-04-20 LAB — BASIC METABOLIC PANEL
BUN/Creatinine Ratio: 16 (ref 10–24)
BUN: 15 mg/dL (ref 8–27)
CO2: 30 mmol/L — ABNORMAL HIGH (ref 20–29)
Calcium: 9.3 mg/dL (ref 8.6–10.2)
Chloride: 94 mmol/L — ABNORMAL LOW (ref 96–106)
Creatinine, Ser: 0.92 mg/dL (ref 0.76–1.27)
Glucose: 114 mg/dL — ABNORMAL HIGH (ref 70–99)
Potassium: 3.9 mmol/L (ref 3.5–5.2)
Sodium: 136 mmol/L (ref 134–144)
eGFR: 93 mL/min/{1.73_m2} (ref >59)

## 2022-04-20 NOTE — Progress Notes (Signed)
Labs stable as long as body weight remains stable leave on same dose of diuretics

## 2022-04-26 ENCOUNTER — Telehealth: Payer: Self-pay

## 2022-04-26 NOTE — Telephone Encounter (Signed)
-----   Message from Wyline Mood, MD sent at 04/20/2022  8:15 AM EDT ----- Labs stable as long as body weight remains stable leave on same dose of diuretics

## 2022-04-26 NOTE — Telephone Encounter (Signed)
Called patient to let him know that his labs are stable so to continue taking the same dose of diuretics. Patient understood and had no further questions. I told him to call me if he had any questions.

## 2022-06-04 ENCOUNTER — Ambulatory Visit
Admission: RE | Admit: 2022-06-04 | Discharge: 2022-06-04 | Disposition: A | Discharge status: Home / Self Care | Payer: Medicaid Other | Source: Ambulatory Visit | Attending: Gastroenterology | Admitting: Gastroenterology

## 2022-06-04 DIAGNOSIS — K7031 Alcoholic cirrhosis of liver with ascites: Secondary | ICD-10-CM | POA: Diagnosis present

## 2022-06-07 ENCOUNTER — Telehealth: Payer: Self-pay | Admitting: Gastroenterology

## 2022-06-07 NOTE — Telephone Encounter (Signed)
Pt left message would like a call back to discuss all his medication

## 2022-06-08 NOTE — Telephone Encounter (Signed)
Called patient back and was not able to leave him a voicemail since it had not been set-up yet.

## 2022-06-12 NOTE — Telephone Encounter (Signed)
Called patient back at [phone] since he left me a voicemsil letting me know that he needs a refill on his Furosemide, Folic Acid. Dr. Tobi Bastos, can I send refills? He still has refills on his Lactulose and

## 2022-06-12 NOTE — Telephone Encounter (Signed)
Called patient back at [phone] since he left me a voicemail letting me know that he needs refills on his Furosemide, Folic Acid. Dr. Tobi Bastos, can I send refills?  He stated that he still has refills on his Lactulose and Eplerenone.

## 2022-06-13 MED ORDER — FUROSEMIDE 40 MG PO TABS: 40.0000 mg | ORAL_TABLET | Freq: Every day | ORAL | 3 refills | Status: DC

## 2022-06-13 MED ORDER — FOLIC ACID 1 MG PO TABS: 1.0000 mg | ORAL_TABLET | Freq: Every day | ORAL | 3 refills | Status: DC

## 2022-06-13 NOTE — Addendum Note (Signed)
Addended by: Adela Ports on: 06/13/2022 11:03 AM   Modules accepted: Orders

## 2022-06-13 NOTE — Telephone Encounter (Signed)
Called patient to let him know that his prescriptions were able to be refilled and sent to [facility] on [address]

## 2022-06-13 NOTE — Telephone Encounter (Signed)
Yes give refills

## 2022-07-03 ENCOUNTER — Other Ambulatory Visit: Payer: Self-pay

## 2022-07-03 ENCOUNTER — Encounter: Payer: Self-pay | Admitting: Gastroenterology

## 2022-07-03 ENCOUNTER — Ambulatory Visit (INDEPENDENT_AMBULATORY_CARE_PROVIDER_SITE_OTHER): Payer: Medicaid Other | Admitting: Gastroenterology

## 2022-07-03 VITALS — BP 122/71 | HR 75 | Temp 97.9°F | Wt 157.8 lb

## 2022-07-03 DIAGNOSIS — K7031 Alcoholic cirrhosis of liver with ascites: Secondary | ICD-10-CM

## 2022-07-03 DIAGNOSIS — K7682 Hepatic encephalopathy: Secondary | ICD-10-CM

## 2022-07-03 DIAGNOSIS — K5909 Other constipation: Secondary | ICD-10-CM

## 2022-07-03 DIAGNOSIS — Z23 Encounter for immunization: Secondary | ICD-10-CM

## 2022-07-03 NOTE — Progress Notes (Signed)
Paul Mood MD, MRCP(U.K) [address]  [address]  [place], [place] [address]  Main: [phone]  Fax: [phone]   Primary Care Physician: Paul Ivan, MD  Primary Gastroenterologist:  Dr. Wyline Willis   Chief Complaint  Patient presents with   Cirrhosis of the liver    HPI: Paul Willis is a 67 y.o. male  Summary of history : Initially referred and seen on 12/15/2020 for cirrhosis of the liver likely secondary to alcohol quit 17 September 2020, ascites, portal hypertension gastropathy no varices.  Admitted in 10/04/2020 with shortness of breath.  Found to have cirrhosis of the liver with ascites, gynecomastia it was found on imaging. .    12/15/2020: INR 0.9 not immune to hepatitis a and B request vaccination negative hepatitis C.  Autoimmune work-up was negative IgE levels were 8200.  Serology was negative.     12/15/2020 not immune to hepatitis A and B otherwise autoimmune panel and testing for viral hepatitis negative On 40 mg Lasix and 50 mg Aldactone   01/11/2022 ultrasound with Doppler shows cirrhosis with moderate to large perihepatic ascites no evidence of portal vein thrombosis 01/17/2022: EGD: No evidence of esophageal varices      Interval history  02/19/2022-07/03/2022   06/04/2022: RUQ USG : cirrhosis   Gained 7 pounds since last visit on Lasix 40 mg a day and eplerenone 75 mg in the evening and 50 mg in the a.m. subsequent ultrasound ascites showed no evidence of ascites  04/19/2022 creatinine 0.92 potassium 3.9.   He is doing well no complaints.  Takes lactulose 15 cc twice daily.  Sometimes has 2 bowel movements a day sometimes has 1 bowel movement a day.  Has some issues with memory.  Denies any alcohol use.  He is on eplerenone and Lasix at the about dosages.  Maintains his body weight at home not noticed any changes.  No other complaints.   Current Outpatient Medications  Medication Sig Dispense Refill   folic acid (FOLVITE) 1 MG tablet Take 1  tablet (1 mg total) by mouth daily. 90 tablet 3   furosemide (LASIX) 40 MG tablet Take 1 tablet (40 mg total) by mouth daily. 90 tablet 3   lactulose (CHRONULAC) 10 GM/15ML solution Take 15 mLs (10 g total) by mouth 2 (two) times daily. 946 mL 10   predniSONE (DELTASONE) 10 MG tablet Take 10 mg by mouth.     spironolactone (ALDACTONE) 50 MG tablet Take 50 mg by mouth 2 (two) times daily.     No current facility-administered medications for this visit.    Allergies as of 07/03/2022 - Review Complete 07/03/2022  Allergen Reaction Noted   Albumin (human) Hives, Itching, and Rash 11/21/2021       ROS:  General: Negative for anorexia, weight loss, fever, chills, fatigue, weakness. ENT: Negative for hoarseness, difficulty swallowing , nasal congestion. CV: Negative for chest pain, angina, palpitations, dyspnea on exertion, peripheral edema.  Respiratory: Negative for dyspnea at rest, dyspnea on exertion, cough, sputum, wheezing.  GI: See history of present illness. GU:  Negative for dysuria, hematuria, urinary incontinence, urinary frequency, nocturnal urination.  Endo: Negative for unusual weight change.    Physical Examination:   BP 122/71   Pulse 75   Temp 97.9 F (36.6 C) (Oral)   Wt 157 lb 12.8 oz (71.6 kg)   BMI 23.30 kg/m   General: Well-nourished, well-developed in no acute distress.  Eyes: No icterus. Conjunctivae pink. Abdomen: Bowel sounds are normal, nontender,  nondistended, no hepatosplenomegaly or masses, no abdominal bruits or hernia , no rebound or guarding.   Extremities: No lower extremity edema. No clubbing or deformities. Neuro: Alert and oriented x 3.  Grossly intact. Skin: Warm and dry, no jaundice.   Psych: Alert and cooperative, normal Willis and affect.   Imaging Studies: US ABDOMEN LIMITED RUQ (LIVER/GB)  Result Date: 06/04/2022 CLINICAL DATA:  Cirrhosis EXAM: ULTRASOUND ABDOMEN LIMITED RIGHT UPPER QUADRANT COMPARISON:  Ultrasound abdomen 03/21/2022  FINDINGS: Gallbladder: No gallstones or wall thickening visualized. No sonographic Murphy sign noted by sonographer. Common bile duct: Diameter: 3 mm Liver: Coarsened echogenicity and nodular contour. No focal lesion. Portal vein is patent on color Doppler imaging with normal direction of blood flow towards the liver. Other: None. IMPRESSION: Cirrhotic morphology of the liver. No focal lesion identified. Electronically Signed   By: Paul Willis M.D.   On: 06/04/2022 09:57    Assessment and Plan:   Paul Willis is a 67 y.o. y/o male here to follow-up for alcoholic liver cirrhosis with ascites controlled medically on eplerenone 50 mg in the a.m. 75 mg in the p.m. and Lasix 40 mg. Did not tolerate spironolactone due to gynecomastia.  Presently he may have mild hepatic encephalopathy elevated IgE incidentally found on liver work up .  recent decompensation in December 2023 large quantity of ascites drained since then not required paracentesis.  He may have a degree of constipation     Plan 1.  Continue diuretics at present dosages 2.  Stop all alcohol which she has continued to stay off alcohol 3.  EGD to screen for esophageal varices in 2027 4.  Right upper quadrant ultrasound in Nov 2024 5.  Elevated IgE follows with Dr. Orlie Willis awaiting CT chest abdomen pelvis 6.   Check CMP CBC INR 7.  Minimal hepatic encephalopathy possible increase lactulose to 15 cc 3 times daily for constipation if he continues to have issues with memory at next visit we will add Xifaxan 8.  He is getting his pneumococcal vaccine hepatitis A and B vaccine today  Dr Paul Mood  MD,MRCP [facility]) Follow up in 6 months

## 2022-07-03 NOTE — Addendum Note (Signed)
Addended by: Adela Ports on: 07/03/2022 03:50 PM   Modules accepted: Orders

## 2022-07-04 ENCOUNTER — Ambulatory Visit: Payer: Medicaid Other

## 2022-07-04 LAB — COMPREHENSIVE METABOLIC PANEL
ALT: 17 IU/L (ref 0–44)
AST: 38 IU/L (ref 0–40)
Albumin: 3.9 g/dL (ref 3.9–4.9)
Alkaline Phosphatase: 96 IU/L (ref 44–121)
BUN/Creatinine Ratio: 12 (ref 10–24)
BUN: 12 mg/dL (ref 8–27)
Bilirubin Total: 0.7 mg/dL (ref 0.0–1.2)
CO2: 28 mmol/L (ref 20–29)
Calcium: 8.9 mg/dL (ref 8.6–10.2)
Chloride: 97 mmol/L (ref 96–106)
Creatinine, Ser: 1.04 mg/dL (ref 0.76–1.27)
Globulin, Total: 3.2 g/dL (ref 1.5–4.5)
Glucose: 125 mg/dL — ABNORMAL HIGH (ref 70–99)
Potassium: 4.4 mmol/L (ref 3.5–5.2)
Sodium: 137 mmol/L (ref 134–144)
Total Protein: 7.1 g/dL (ref 6.0–8.5)
eGFR: 80 mL/min/{1.73_m2} (ref >59)

## 2022-07-04 LAB — CBC WITH DIFFERENTIAL/PLATELET
Basophils Absolute: 0 10*3/uL (ref 0.0–0.2)
Basos: 0 %
EOS (ABSOLUTE): 0.1 10*3/uL (ref 0.0–0.4)
Eos: 2 %
Hematocrit: 45.3 % (ref 37.5–51.0)
Hemoglobin: 14.9 g/dL (ref 13.0–17.7)
Immature Grans (Abs): 0 10*3/uL (ref 0.0–0.1)
Immature Granulocytes: 0 %
Lymphocytes Absolute: 1 10*3/uL (ref 0.7–3.1)
Lymphs: 18 %
MCH: 30.2 pg (ref 26.6–33.0)
MCHC: 32.9 g/dL (ref 31.5–35.7)
MCV: 92 fL (ref 79–97)
Monocytes Absolute: 0.5 10*3/uL (ref 0.1–0.9)
Monocytes: 9 %
Neutrophils Absolute: 4 10*3/uL (ref 1.4–7.0)
Neutrophils: 71 %
Platelets: 141 10*3/uL — ABNORMAL LOW (ref 150–450)
RBC: 4.93 x10E6/uL (ref 4.14–5.80)
RDW: 13.2 % (ref 11.6–15.4)
WBC: 5.7 10*3/uL (ref 3.4–10.8)

## 2022-07-04 LAB — PROTIME-INR
INR: 1 (ref 0.9–1.2)
Prothrombin Time: 10.6 s (ref 9.1–12.0)

## 2022-07-04 NOTE — Progress Notes (Signed)
Labs stable MELD score of 6

## 2022-07-04 NOTE — Addendum Note (Signed)
Addended by: Adela Ports on: 07/04/2022 03:32 PM   Modules accepted: Orders

## 2022-07-05 ENCOUNTER — Telehealth: Payer: Self-pay

## 2022-07-05 NOTE — Telephone Encounter (Signed)
-----   Message from Wyline Mood, MD sent at 07/04/2022  8:09 AM EDT ----- Labs stable MELD score of 6

## 2022-07-05 NOTE — Telephone Encounter (Signed)
Called patient and let him know the below information and he was happy to hear that everything is stable. Patient had no further questions.

## 2022-07-16 ENCOUNTER — Ambulatory Visit: Payer: Medicaid Other

## 2022-11-26 ENCOUNTER — Ambulatory Visit: Payer: Medicaid Other | Attending: Gastroenterology

## 2022-11-27 ENCOUNTER — Telehealth: Payer: Self-pay

## 2022-11-27 NOTE — Telephone Encounter (Signed)
Patient no showed ultrasound RUQ  appointment on 11/26/2022. Please reschedule if Dr. Tobi Bastos wants patient to have reschedule appointment.

## 2022-11-27 NOTE — Telephone Encounter (Signed)
Called patient and left him a detailed message to call central scheduling [phone]) if he was interested in rescheduling his ultrasound.I will also send him a letter with the above information.

## 2022-12-18 ENCOUNTER — Other Ambulatory Visit: Payer: Self-pay | Admitting: Gastroenterology

## 2022-12-27 ENCOUNTER — Ambulatory Visit: Payer: Medicaid Other | Admitting: Gastroenterology

## 2022-12-27 NOTE — Progress Notes (Unsigned)
Wyline Mood MD, MRCP(U.K) [address]  [address]  [place], [place] [address]  Main: [phone]  Fax: [phone]   Primary Care Physician: Marisue Ivan, MD  Primary Gastroenterologist:  Dr. Wyline Mood   No chief complaint on file.   HPI: Alyxander Malonzo is a 67 y.o. male Summary of history : Initially referred and seen on 12/15/2020 for cirrhosis of the liver likely secondary to alcohol quit 17 September 2020, ascites, portal hypertension gastropathy no varices.  Admitted in 10/04/2020 with shortness of breath.  Found to have cirrhosis of the liver with ascites, gynecomastia it was found on imaging. .     12/15/2020: INR 0.9 not immune to hepatitis a and B request vaccination negative hepatitis C.  Autoimmune work-up was negative IgE levels were 8200.  Serology was negative.     12/15/2020 not immune to hepatitis A and B otherwise autoimmune panel and testing for viral hepatitis negative   01/11/2022 ultrasound with Doppler shows cirrhosis with moderate to large perihepatic ascites no evidence of portal vein thrombosis 01/17/2022: EGD: No evidence of esophageal varices 06/04/2022: RUQ USG : cirrhosis       Interval history  07/03/2022-12/27/2022   No show for ultrasound   Gained 7 pounds since last visit on Lasix 40 mg a day and eplerenone 75 mg in the evening and 50 mg in the a.m. subsequent ultrasound ascites showed no evidence of ascites    He is doing well no complaints.  Takes lactulose 15 cc twice daily.  Sometimes has 2 bowel movements a day sometimes has 1 bowel movement a day.  Has some issues with memory.  Denies any alcohol use.  He is on eplerenone and Lasix at the about dosages.  Maintains his body weight at home not noticed any changes.  No other complaints.   Current Outpatient Medications  Medication Sig Dispense Refill   eplerenone (INSPRA) 25 MG tablet TAKE 2 TABLETS BY MOUTH IN THE MORNING AND 3 TABLETS IN THE EVENING EVERY DAY 150 tablet 0    eplerenone (INSPRA) 50 MG tablet Take 50 mg by mouth daily.     folic acid (FOLVITE) 1 MG tablet Take 1 tablet (1 mg total) by mouth daily. 90 tablet 3   furosemide (LASIX) 40 MG tablet Take 1 tablet (40 mg total) by mouth daily. 90 tablet 3   lactulose (CHRONULAC) 10 GM/15ML solution Take 15 mLs (10 g total) by mouth 2 (two) times daily. 946 mL 10   predniSONE (DELTASONE) 10 MG tablet Take 10 mg by mouth.     No current facility-administered medications for this visit.    Allergies as of 12/27/2022 - Review Complete 07/03/2022  Allergen Reaction Noted   Albumin (human) Hives, Itching, and Rash 11/21/2021      ROS:  General: Negative for anorexia, weight loss, fever, chills, fatigue, weakness. ENT: Negative for hoarseness, difficulty swallowing , nasal congestion. CV: Negative for chest pain, angina, palpitations, dyspnea on exertion, peripheral edema.  Respiratory: Negative for dyspnea at rest, dyspnea on exertion, cough, sputum, wheezing.  GI: See history of present illness. GU:  Negative for dysuria, hematuria, urinary incontinence, urinary frequency, nocturnal urination.  Endo: Negative for unusual weight change.    Physical Examination:   There were no vitals taken for this visit.  General: Well-nourished, well-developed in no acute distress.  Eyes: No icterus. Conjunctivae pink. Mouth: Oropharyngeal mucosa moist and pink , no lesions erythema or exudate. Lungs: Clear to auscultation bilaterally. Non-labored. Heart: Regular rate  and rhythm, no murmurs rubs or gallops.  Abdomen: Bowel sounds are normal, nontender, nondistended, no hepatosplenomegaly or masses, no abdominal bruits or hernia , no rebound or guarding.   Extremities: No lower extremity edema. No clubbing or deformities. Neuro: Alert and oriented x 3.  Grossly intact. Skin: Warm and dry, no jaundice.   Psych: Alert and cooperative, normal mood and affect.   Imaging Studies: No results found.  Assessment  and Plan:   Mario Bowdre is a 67 y.o. y/o male here to follow-up for alcoholic liver cirrhosis with ascites controlled medically on eplerenone 50 mg in the a.m. 75 mg in the p.m. and Lasix 40 mg.Did not tolerate spironolactone due to gynecomastia.  Presently he may have mild hepatic encephalopathy elevated IgE incidentally found on liver work up .  recent decompensation in December 2023 large quantity of ascites drained since then not required paracentesis.  He may have a degree of constipation. MELD 6 in 06/2022  Plan 1.  Continue diuretics at present dosages 2.  Stop all alcohol which she has continued to stay off alcohol 3.  EGD to screen for esophageal varices in 2027 4.  Right upper quadrant ultrasound  was due in Nov 2024- no show- will reschedule  5.  Elevated IgE follows with Dr. Orlie Dakin awaiting CT chest abdomen pelvis 6.   Check CMP CBC INR 7.  Minimal hepatic encephalopathy possible increase lactulose to 15 cc 3 times daily for constipation if he continues to have issues with memory at next visit we will add Xifaxan 8.  He is getting his pneumococcal vaccine hepatitis A and B vaccine today      Dr Wyline Mood  MD,MRCP [facility]) Follow up in ***  BP check ***

## 2023-01-16 IMAGING — CT CT HEAD W/O CM
3 series · 16 of 47 positions shown, 19 images · non-contrast
Comparison: None.

CLINICAL DATA: Acute neuro deficit.

EXAM:
CT HEAD WITHOUT CONTRAST
TECHNIQUE: Contiguous axial images were obtained from the base of the skull
through the vertex without intravenous contrast.

[Series 2: head wo · axial · 0.42mm/px · z∈[+272,+402]mm · 10 of 32 slices shown, 13 images]
[im 3/32  brain]
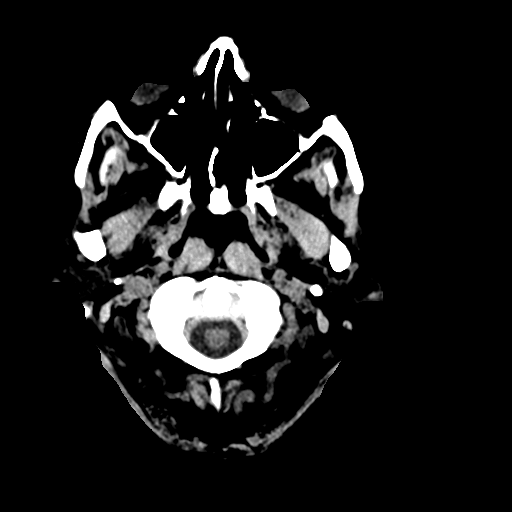
[im 3/32  bone]
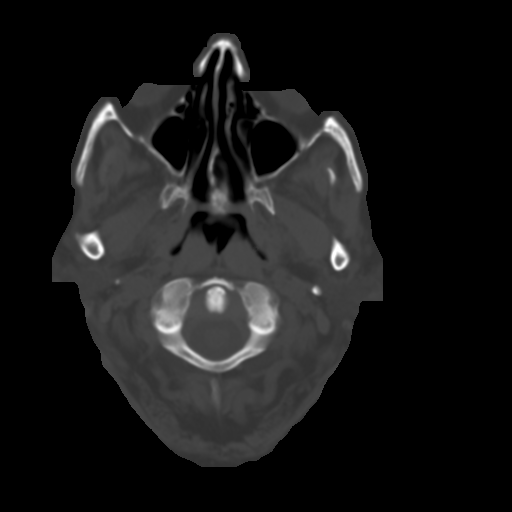
[im 6/32  brain]
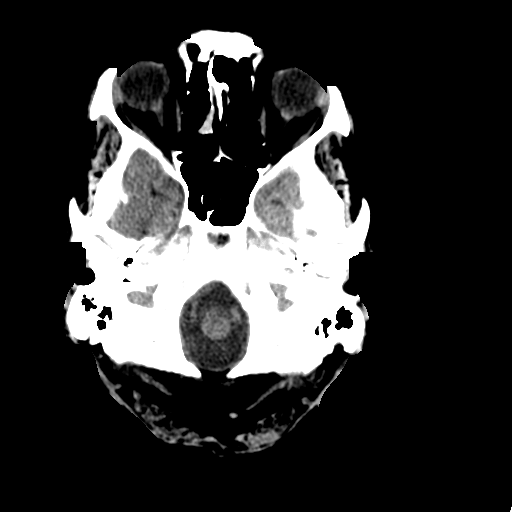
[im 9/32  brain]
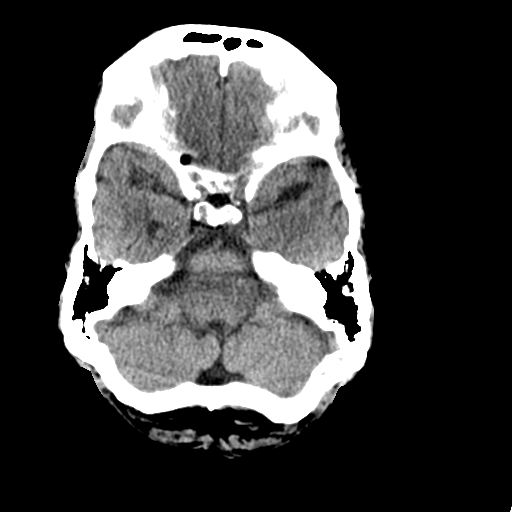
[im 11/32  brain]
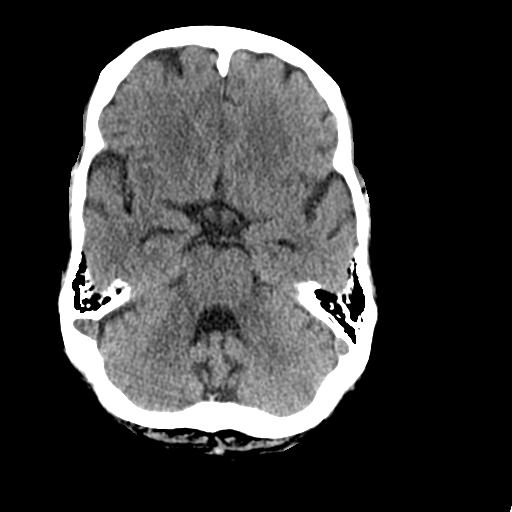
[im 14/32  brain]
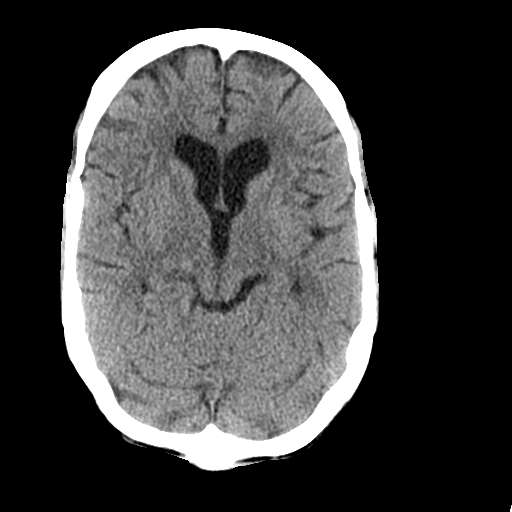
[im 14/32  bone]
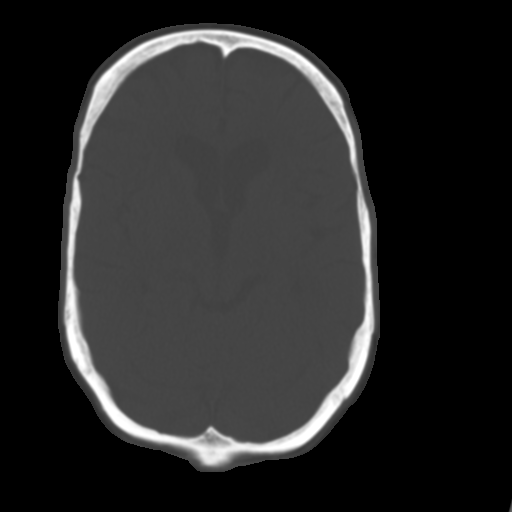
[im 18/32  brain]
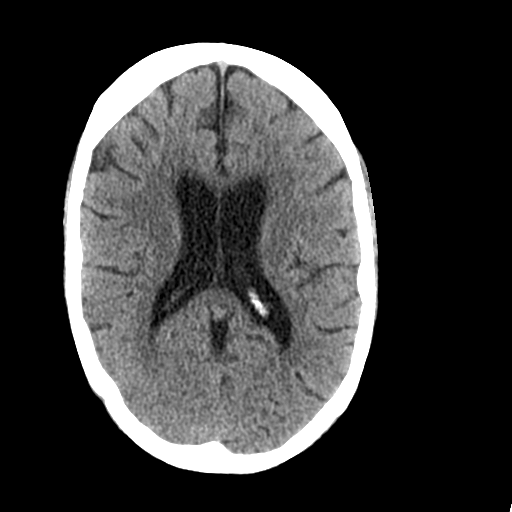
[im 21/32  brain]
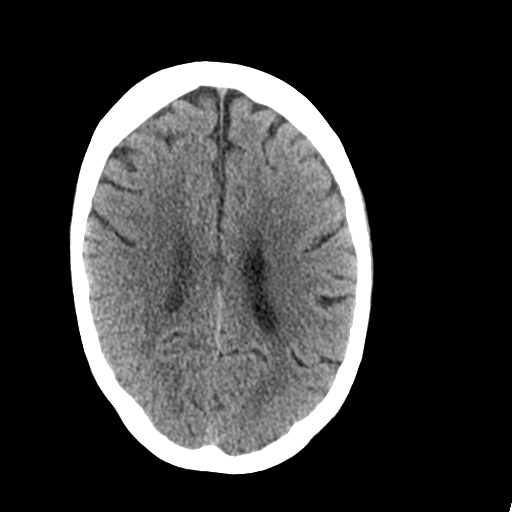
[im 24/32  brain]
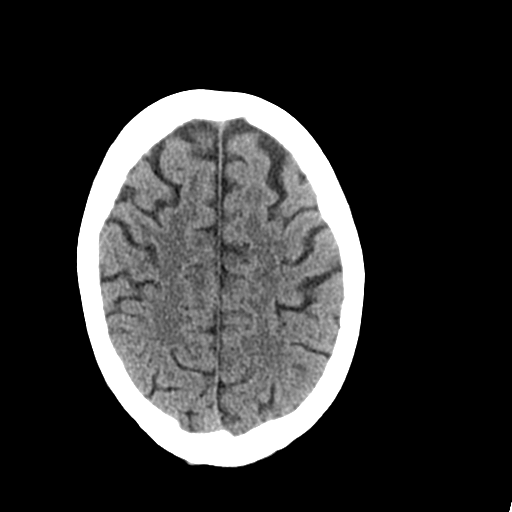
[im 26/32  brain]
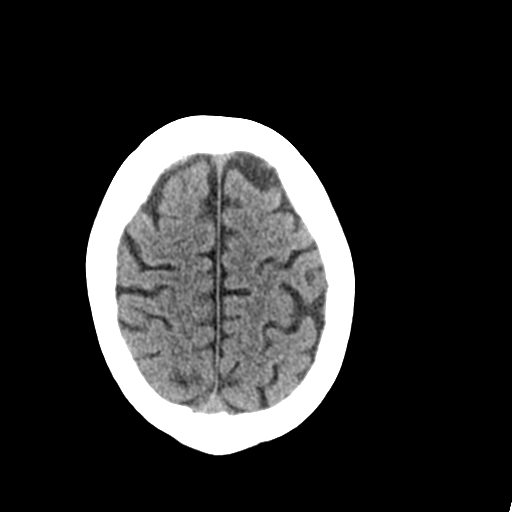
[im 26/32  bone]
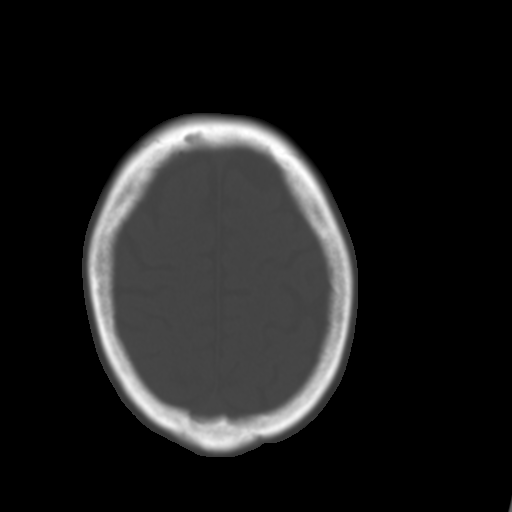
[im 29/32  brain]
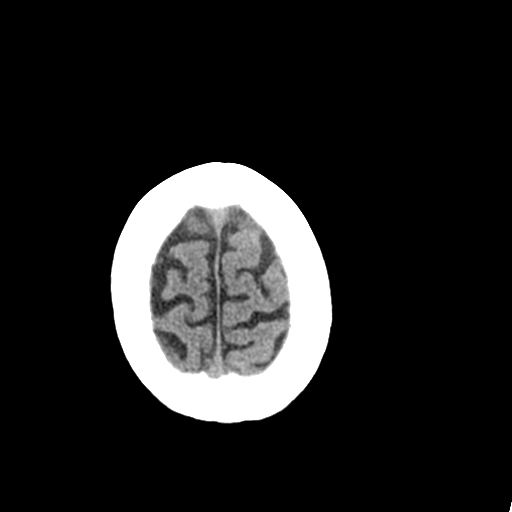

[Series 4: coronal soft tissue · coronal · 0.35mm/px · 3 of 67 slices shown]
[im 23/67  brain]
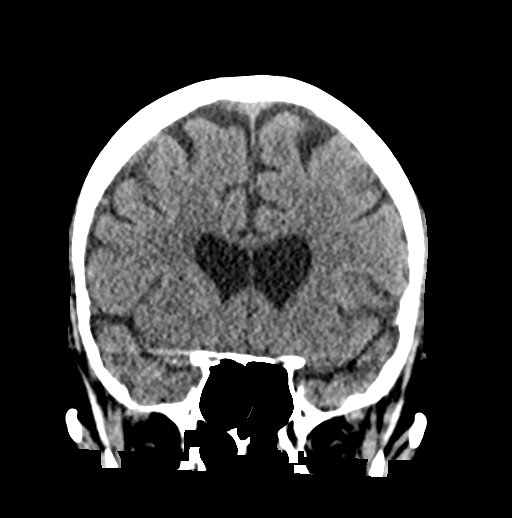
[im 30/67  brain]
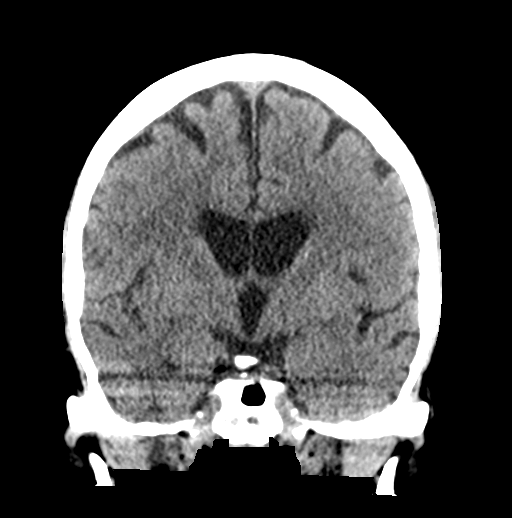
[im 37/67  brain]
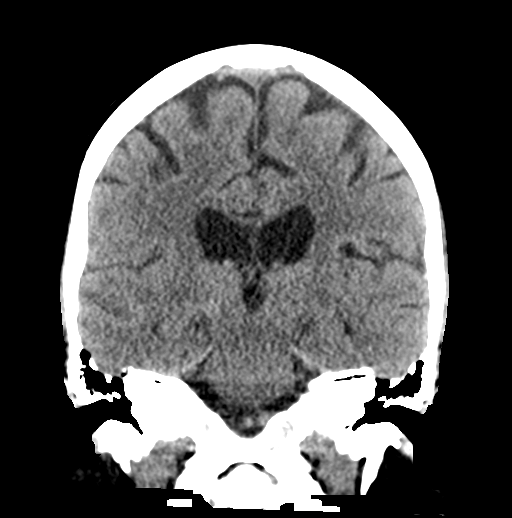

[Series 5: sagittal soft tissue · sagittal · 0.36mm/px · 3 of 60 slices shown]
[im 20/60  brain]
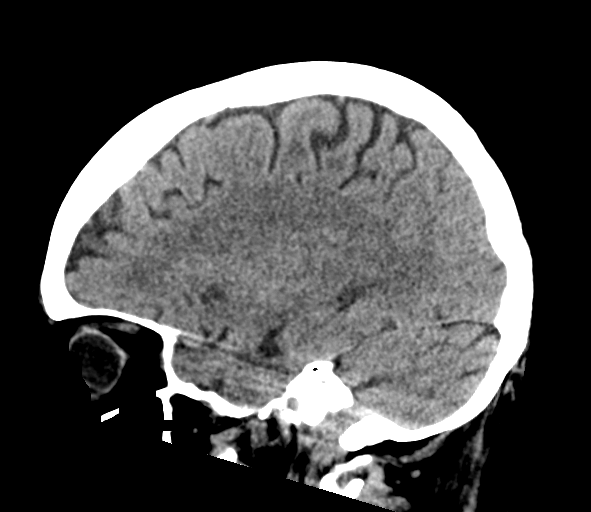
[im 30/60  brain]
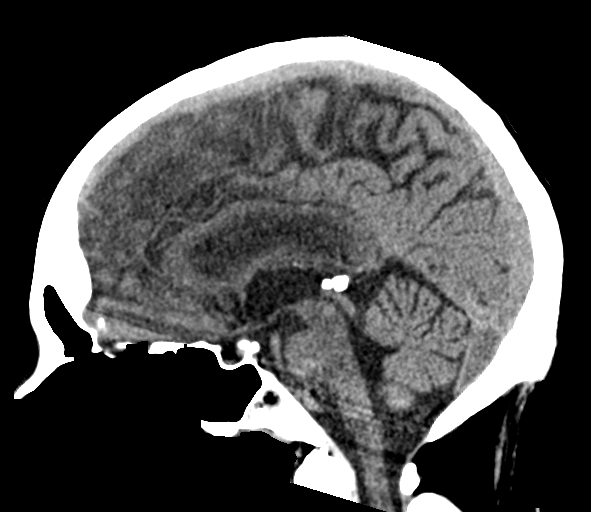
[im 40/60  brain]
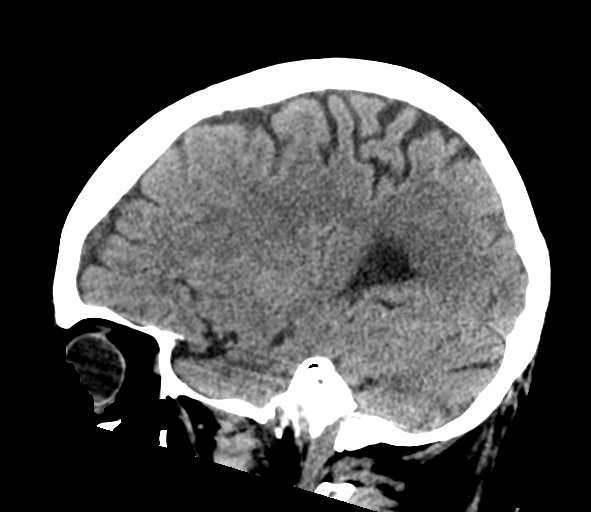

[16 of 47 positions shown; findings below may reference images not displayed]

FINDINGS: Brain: No evidence of acute infarction, hemorrhage, hydrocephalus,
extra-axial collection or mass lesion/mass effect.

Vascular: No hyperdense vessel or unexpected calcification.

Skull: Normal. Negative for fracture or focal lesion.

Sinuses/Orbits: No acute finding.

Other: None.
IMPRESSION: No acute intracranial abnormality seen.

## 2023-01-21 ENCOUNTER — Other Ambulatory Visit: Payer: Self-pay | Admitting: Gastroenterology

## 2023-01-30 IMAGING — US US EXTREM LOW VENOUS
1 series · 14 of 24 positions shown · non-contrast
Comparison: None.

CLINICAL DATA: Bilateral lower extremity edema, erythema

EXAM:
BILATERAL LOWER EXTREMITY VENOUS DOPPLER ULTRASOUND
TECHNIQUE: Gray-scale sonography with compression, as well as color and duplex
ultrasound, were performed to evaluate the deep venous system(s)
from the level of the common femoral vein through the popliteal and
proximal calf veins.

[Series 1: us venous img lower bilat (dvt) · portal-venous · 14 of 63 slices shown]
[im 1/63]
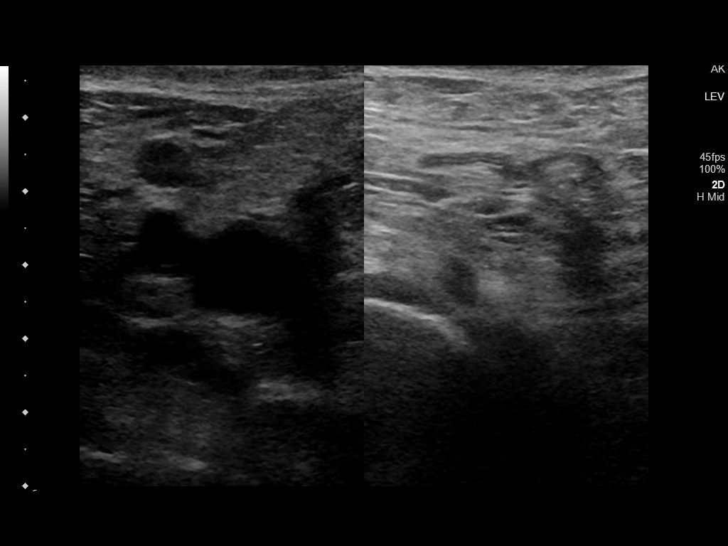
[im 6/63]
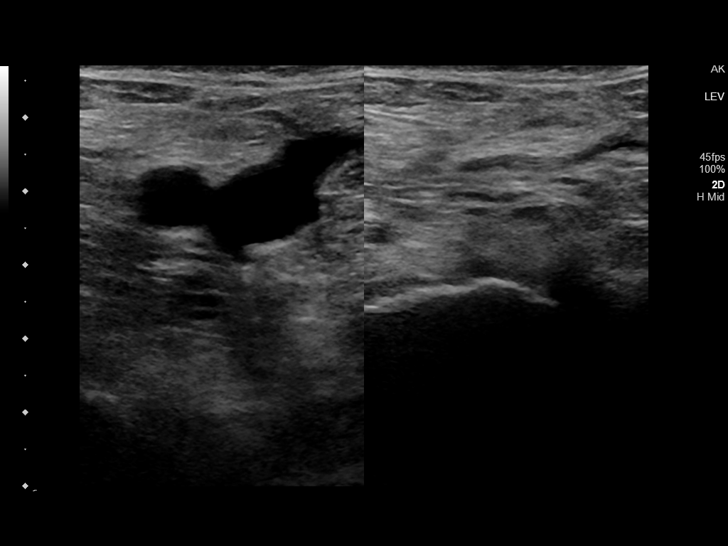
[im 11/63]
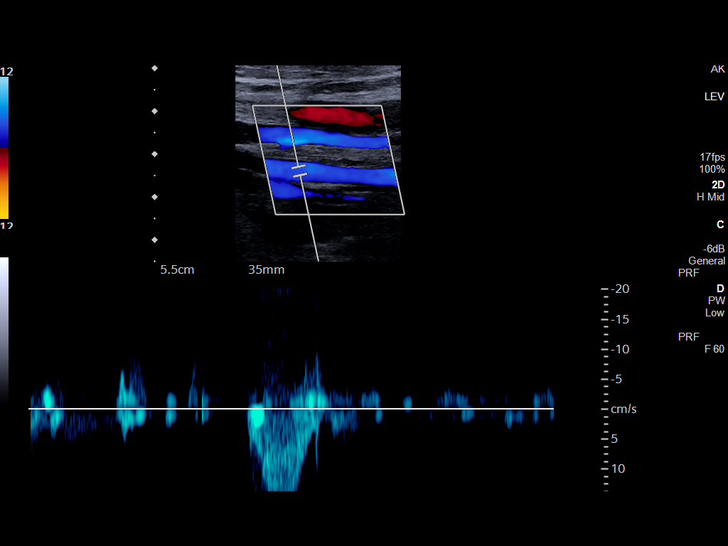
[im 17/63]
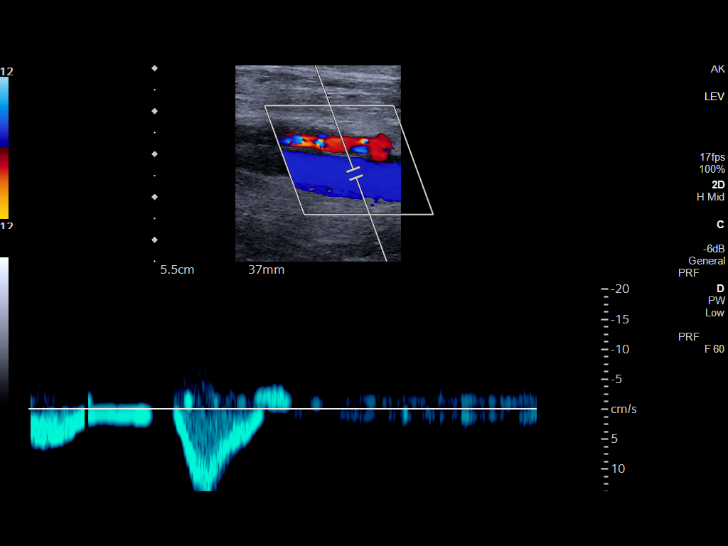
[im 19/63]
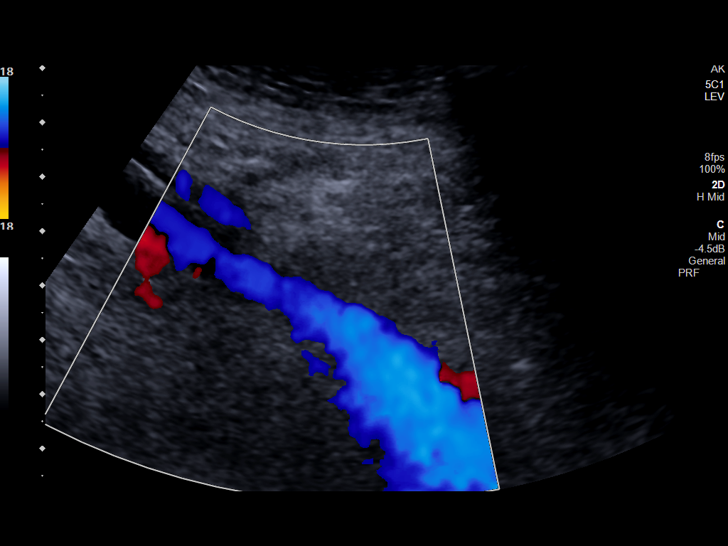
[im 25/63]
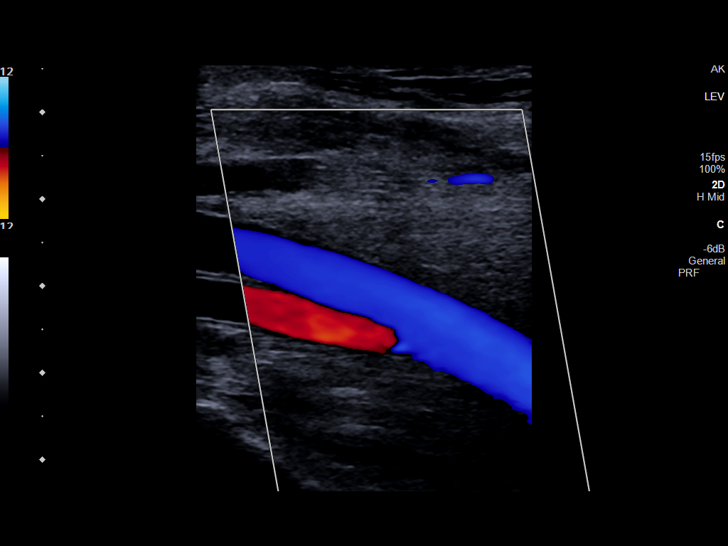
[im 30/63]
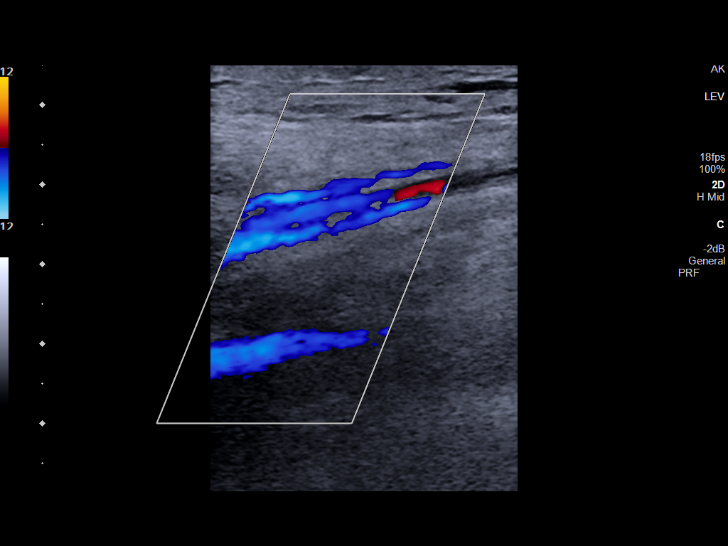
[im 33/63]
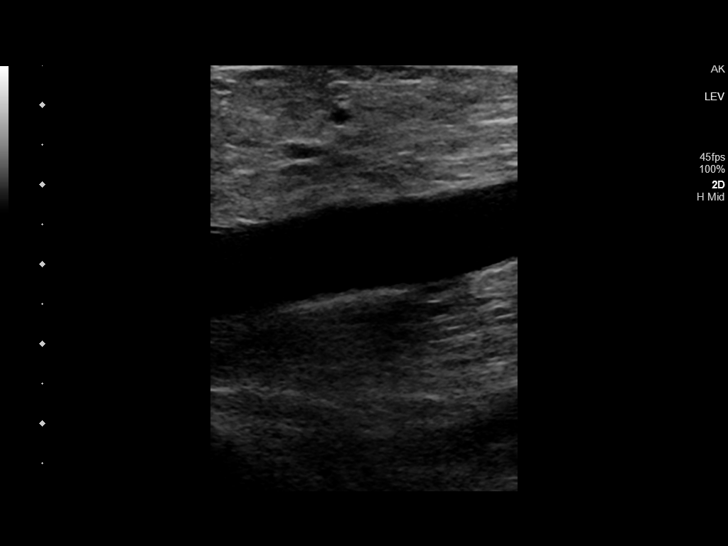
[im 38/63]
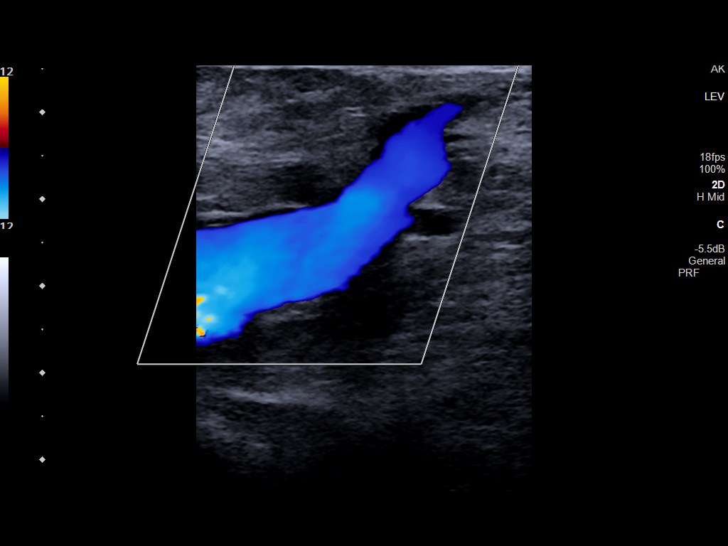
[im 44/63]
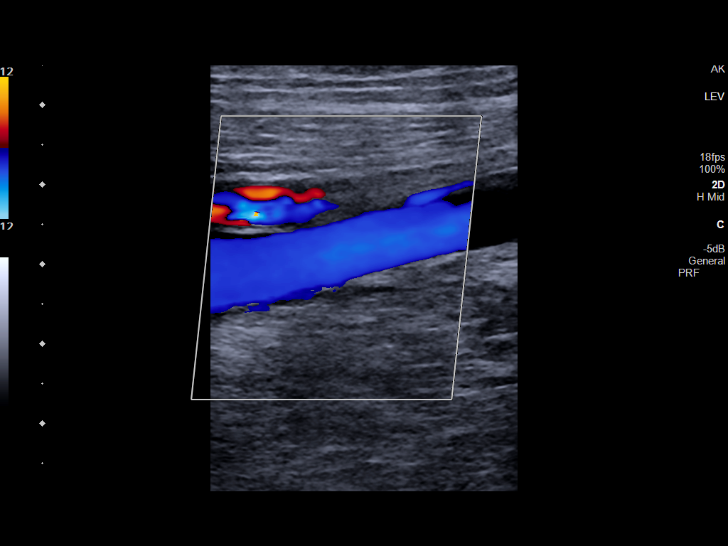
[im 49/63]
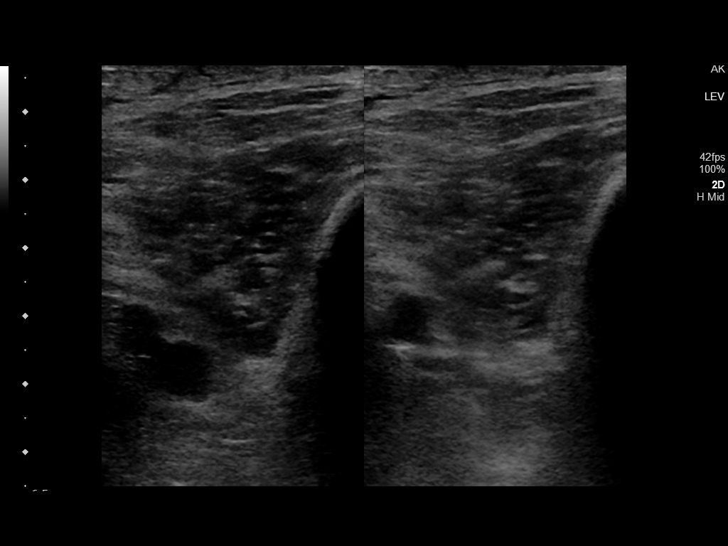
[im 52/63]
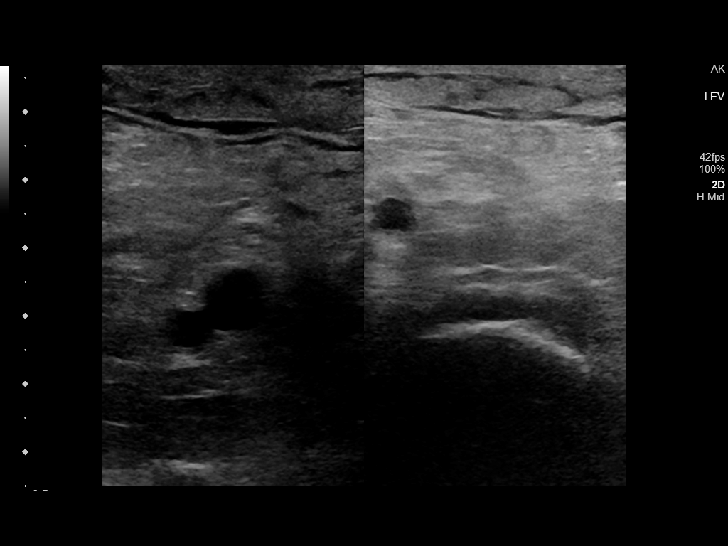
[im 57/63]
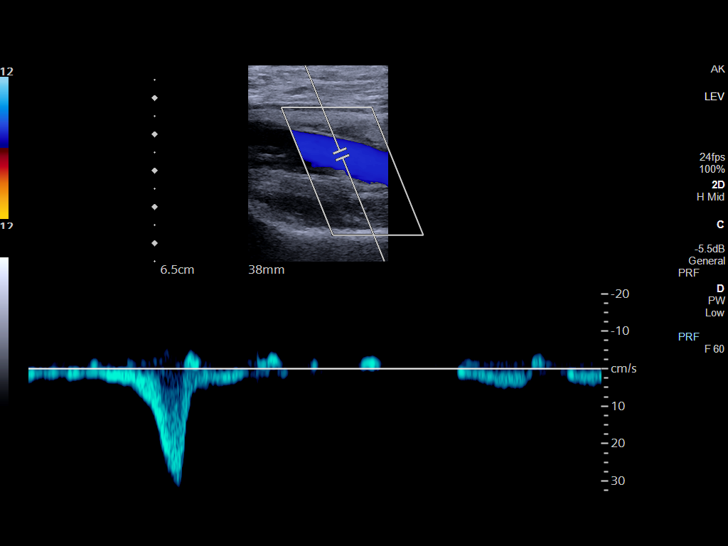
[im 63/63]
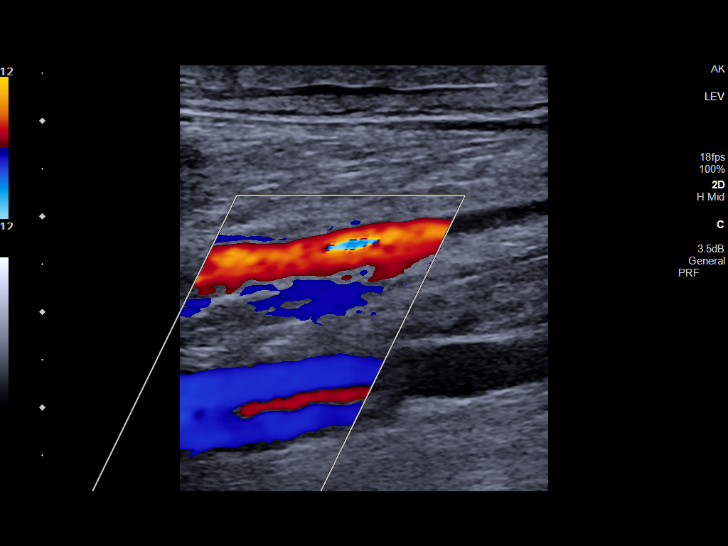

[14 of 24 positions shown; findings below may reference images not displayed]

FINDINGS: VENOUS

Normal compressibility of the common femoral, superficial femoral,
and popliteal veins, as well as the visualized calf veins.
Visualized portions of profunda femoral vein and great saphenous
vein unremarkable. No filling defects to suggest DVT on grayscale or
color Doppler imaging. Doppler waveforms show normal direction of
venous flow, normal respiratory plasticity and response to
augmentation.

Limited views of the contralateral common femoral vein are
unremarkable.

OTHER

None.

Limitations: none
IMPRESSION: Negative.

## 2023-02-05 ENCOUNTER — Other Ambulatory Visit: Payer: Self-pay

## 2023-02-05 DIAGNOSIS — K7682 Hepatic encephalopathy: Secondary | ICD-10-CM

## 2023-02-05 DIAGNOSIS — K7031 Alcoholic cirrhosis of liver with ascites: Secondary | ICD-10-CM

## 2023-02-05 DIAGNOSIS — I8500 Esophageal varices without bleeding: Secondary | ICD-10-CM

## 2023-02-05 MED ORDER — EPLERENONE 25 MG PO TABS: ORAL_TABLET | ORAL | 6 refills | Status: AC

## 2023-02-05 MED ORDER — FUROSEMIDE 40 MG PO TABS: 40.0000 mg | ORAL_TABLET | Freq: Every day | ORAL | 3 refills | Status: AC

## 2023-02-05 MED ORDER — FOLIC ACID 1 MG PO TABS: 1.0000 mg | ORAL_TABLET | Freq: Every day | ORAL | 3 refills | Status: AC

## 2023-02-05 MED ORDER — LACTULOSE 10 GM/15ML PO SOLN: 10.0000 g | Freq: Two times a day (BID) | ORAL | 10 refills | Status: AC

## 2023-02-13 ENCOUNTER — Ambulatory Visit
Admission: RE | Admit: 2023-02-13 | Discharge: 2023-02-13 | Disposition: A | Discharge status: Home / Self Care | Payer: BC Managed Care – PPO | Source: Ambulatory Visit | Attending: Gastroenterology | Admitting: Gastroenterology

## 2023-02-13 DIAGNOSIS — K7682 Hepatic encephalopathy: Secondary | ICD-10-CM | POA: Insufficient documentation

## 2023-02-13 DIAGNOSIS — I8500 Esophageal varices without bleeding: Secondary | ICD-10-CM | POA: Insufficient documentation

## 2023-02-13 DIAGNOSIS — K7031 Alcoholic cirrhosis of liver with ascites: Secondary | ICD-10-CM | POA: Diagnosis present

## 2023-03-05 ENCOUNTER — Ambulatory Visit: Payer: Medicaid Other | Admitting: Gastroenterology

## 2023-05-15 ENCOUNTER — Telehealth: Payer: Self-pay | Admitting: Gastroenterology

## 2023-05-15 NOTE — Telephone Encounter (Signed)
 The patient called to schedule a follow-up appointment at our office. I informed him that we currently do not have any available appointments for follow-ups at this time, as our current providers are leaving and we are in the process of onboarding new providers. I offered to add him to our call-back list and assured him that we will contact him as soon as an appointment becomes available.

## 2023-05-16 ENCOUNTER — Emergency Department

## 2023-05-16 ENCOUNTER — Emergency Department: Admission: EM | Admit: 2023-05-16 | Discharge: 2023-05-16 | Disposition: A | Discharge status: Home / Self Care

## 2023-05-16 ENCOUNTER — Other Ambulatory Visit: Payer: Self-pay

## 2023-05-16 DIAGNOSIS — S0181XA Laceration without foreign body of other part of head, initial encounter: Secondary | ICD-10-CM | POA: Diagnosis not present

## 2023-05-16 DIAGNOSIS — J449 Chronic obstructive pulmonary disease, unspecified: Secondary | ICD-10-CM | POA: Diagnosis not present

## 2023-05-16 DIAGNOSIS — S62514A Nondisplaced fracture of proximal phalanx of right thumb, initial encounter for closed fracture: Secondary | ICD-10-CM | POA: Diagnosis not present

## 2023-05-16 DIAGNOSIS — I2510 Atherosclerotic heart disease of native coronary artery without angina pectoris: Secondary | ICD-10-CM | POA: Diagnosis not present

## 2023-05-16 DIAGNOSIS — W07XXXA Fall from chair, initial encounter: Secondary | ICD-10-CM | POA: Diagnosis not present

## 2023-05-16 DIAGNOSIS — Y9389 Activity, other specified: Secondary | ICD-10-CM | POA: Diagnosis not present

## 2023-05-16 DIAGNOSIS — S80811A Abrasion, right lower leg, initial encounter: Secondary | ICD-10-CM | POA: Insufficient documentation

## 2023-05-16 DIAGNOSIS — S51801A Unspecified open wound of right forearm, initial encounter: Secondary | ICD-10-CM

## 2023-05-16 DIAGNOSIS — R55 Syncope and collapse: Secondary | ICD-10-CM | POA: Insufficient documentation

## 2023-05-16 DIAGNOSIS — S0081XA Abrasion of other part of head, initial encounter: Secondary | ICD-10-CM

## 2023-05-16 DIAGNOSIS — S6991XA Unspecified injury of right wrist, hand and finger(s), initial encounter: Secondary | ICD-10-CM | POA: Diagnosis present

## 2023-05-16 DIAGNOSIS — Z23 Encounter for immunization: Secondary | ICD-10-CM | POA: Diagnosis not present

## 2023-05-16 DIAGNOSIS — S80812A Abrasion, left lower leg, initial encounter: Secondary | ICD-10-CM | POA: Diagnosis not present

## 2023-05-16 DIAGNOSIS — F17210 Nicotine dependence, cigarettes, uncomplicated: Secondary | ICD-10-CM | POA: Insufficient documentation

## 2023-05-16 DIAGNOSIS — W19XXXA Unspecified fall, initial encounter: Secondary | ICD-10-CM

## 2023-05-16 DIAGNOSIS — S51811A Laceration without foreign body of right forearm, initial encounter: Secondary | ICD-10-CM | POA: Insufficient documentation

## 2023-05-16 DIAGNOSIS — M79641 Pain in right hand: Secondary | ICD-10-CM

## 2023-05-16 LAB — COMPREHENSIVE METABOLIC PANEL WITH GFR
ALT: 29 U/L (ref 0–44)
AST: 44 U/L — ABNORMAL HIGH (ref 15–41)
Albumin: 3.5 g/dL (ref 3.5–5.0)
Alkaline Phosphatase: 78 U/L (ref 38–126)
Anion gap: 10 (ref 5–15)
BUN: 9 mg/dL (ref 8–23)
CO2: 24 mmol/L (ref 22–32)
Calcium: 8.5 mg/dL — ABNORMAL LOW (ref 8.9–10.3)
Chloride: 99 mmol/L (ref 98–111)
Creatinine, Ser: 0.74 mg/dL (ref 0.61–1.24)
GFR, Estimated: >60 mL/min (ref >60)
Glucose, Bld: 95 mg/dL (ref 70–99)
Potassium: 3.6 mmol/L (ref 3.5–5.1)
Sodium: 133 mmol/L — ABNORMAL LOW (ref 135–145)
Total Bilirubin: 0.9 mg/dL (ref 0.0–1.2)
Total Protein: 7.4 g/dL (ref 6.5–8.1)

## 2023-05-16 LAB — CK: Total CK: 468 U/L — ABNORMAL HIGH (ref 49–397)

## 2023-05-16 LAB — CBC WITH DIFFERENTIAL/PLATELET
Abs Immature Granulocytes: 0.01 10*3/uL (ref 0.00–0.07)
Basophils Absolute: 0 10*3/uL (ref 0.0–0.1)
Basophils Relative: 1 %
Eosinophils Absolute: 0.2 10*3/uL (ref 0.0–0.5)
Eosinophils Relative: 3 %
HCT: 43 % (ref 39.0–52.0)
Hemoglobin: 14.8 g/dL (ref 13.0–17.0)
Immature Granulocytes: 0 %
Lymphocytes Relative: 23 %
Lymphs Abs: 1.1 10*3/uL (ref 0.7–4.0)
MCH: 33 pg (ref 26.0–34.0)
MCHC: 34.4 g/dL (ref 30.0–36.0)
MCV: 95.8 fL (ref 80.0–100.0)
Monocytes Absolute: 0.5 10*3/uL (ref 0.1–1.0)
Monocytes Relative: 10 %
Neutro Abs: 3 10*3/uL (ref 1.7–7.7)
Neutrophils Relative %: 63 %
Platelets: 108 10*3/uL — ABNORMAL LOW (ref 150–400)
RBC: 4.49 MIL/uL (ref 4.22–5.81)
RDW: 12.6 % (ref 11.5–15.5)
WBC: 4.8 10*3/uL (ref 4.0–10.5)
nRBC: 0 % (ref 0.0–0.2)

## 2023-05-16 LAB — TROPONIN I (HIGH SENSITIVITY)
Troponin I (High Sensitivity): 10 ng/L (ref <18)
Troponin I (High Sensitivity): 9 ng/L (ref <18)

## 2023-05-16 LAB — LIPASE, BLOOD: Lipase: 38 U/L (ref 11–51)

## 2023-05-16 MED ORDER — TETANUS-DIPHTH-ACELL PERTUSSIS 5-2.5-18.5 LF-MCG/0.5 IM SUSY
0.5000 mL | PREFILLED_SYRINGE | Freq: Once | INTRAMUSCULAR | Status: AC
  Administered 2023-05-16: 0.5 mL via INTRAMUSCULAR
  Filled 2023-05-16: qty 0.5

## 2023-05-16 MED ORDER — ACETAMINOPHEN 500 MG PO TABS: 500.0000 mg | ORAL_TABLET | Freq: Three times a day (TID) | ORAL | 2 refills | Status: AC | PRN

## 2023-05-16 NOTE — ED Provider Notes (Signed)
 [facility] Provider Note    Event Date/Time   First MD Initiated Contact with Patient 05/16/23 1849     (approximate)   History   No chief complaint on file.  Patient states he fell asleep in his chair last night watching TV, go tup to eat peaches, went back to chair, up again got some ice cream.  Got up for a cigarette, woke up outside on wood deck outside. Has no memory of falling. Woke up, bleeding from forehead.  AAOx3. Skin warm and dry. NAD. Ambulatory. NAD   HPI Paul Willis is a 68 y.o. male PMH  CAD, alcoholic cirrhosis, COPD presents for evaluation after a fall - Patient tells me he got up around 2:45am to watch television, ate some food, and then went to eat ice cream and smoke a cigarette out of his door.  Later woke up facedown on the ground on his deck with blood on his forehead.  No recollection of falling.  No preceding symptoms.  Does not drink any alcohol.  Sure how long he was down though he did wake up before sunrise. - No vomiting - Not on blood thinners - Did have 1 prior episode of syncope during a blood draw - Feels well currently, only complaint is right hand pain - No recent surgery/recent stress travel, no history of DVT/PE, no leg swelling, no chest pain      Physical Exam   Triage Vital Signs: ED Triage Vitals [05/16/23 1309]  Encounter Vitals Group     BP 135/68     Systolic BP Percentile      Diastolic BP Percentile      Pulse Rate 90     Resp 16     Temp 97.6 F (36.4 C)     Temp Source Oral     SpO2 100 %     Weight 157 lb 13.6 oz (71.6 kg)     Height      Head Circumference      Peak Flow      Pain Score      Pain Loc      Pain Education      Exclude from Growth Chart     Most recent vital signs: Vitals:   05/16/23 1756 05/16/23 2003  BP: 120/71 112/72  Pulse: 86 77  Resp: 16 16  Temp: 97.8 F (36.6 C) 97.7 F (36.5 C)  SpO2: 100% 100%     General: Awake, no distress.  HEENT: For facial  abrasion/laceration to left forehead (above 2.5 cm), hemostatic, not grossly contaminated.  Mild bruising around left eye.  No maxillary tenderness. Neck:  Range of motion, no tenderness to palpation in midline Back:  No midline ttp CV:  Good peripheral perfusion. RRR, RP 2+ Resp:  Normal effort. CTAB Abd:  No distention. Nontender to deep palpation throughout Ext:  Full range of motion of all joints of bilateral upper and lower extremities.  Skin avulsion on right forearm, about 3 cm.  Some bruising at the base of right thumb, no deformity, mild tenderness to palpation in this region.  Scattered abrasions to bilateral shins.  No tenderness to palpation elsewhere throughout bilateral upper and lower extremities.   ED Results / Procedures / Treatments   Labs (all labs ordered are listed, but only abnormal results are displayed) Labs Reviewed  COMPREHENSIVE METABOLIC PANEL WITH GFR - Abnormal; Notable for the following components:      Result Value   Sodium 133 (*)  Calcium 8.5 (*)    AST 44 (*)    All other components within normal limits  CBC WITH DIFFERENTIAL/PLATELET - Abnormal; Notable for the following components:   Platelets 108 (*)    All other components within normal limits  CK - Abnormal; Notable for the following components:   Total CK 468 (*)    All other components within normal limits  LIPASE, BLOOD  URINALYSIS, ROUTINE W REFLEX MICROSCOPIC  TROPONIN I (HIGH SENSITIVITY)  TROPONIN I (HIGH SENSITIVITY)     EKG  Ecg = sinus rhythm, rate 76, no ST elevation or depression, no significant repolarization abnormality, normal axis, normal intervals.  No evidence of ischemia on my read.   RADIOLOGY Radiology interpreted by myself and radiology reports reviewed.  CT head and chest x-ray unremarkable.  X-ray right hand with small right digit intra-articular fracture.  PROCEDURES:  Critical Care performed: No  Procedures   MEDICATIONS ORDERED IN ED: Medications   Tdap (BOOSTRIX) injection 0.5 mL (0.5 mLs Intramuscular Given 05/16/23 1922)     IMPRESSION / MDM / ASSESSMENT AND PLAN / ED COURSE  I reviewed the triage vital signs and the nursing notes.                              DDX/MDM/AP: Differential diagnosis includes, but is not limited to, high concern for syncopal episode given history though consider possibility of mechanical fall with concussion.  Do not clinically suspect PE, ACS though consider transient arrhythmia.  Consider underlying skull fracture, intracranial hemorrhage.  Do not clinically suspect C-spine injury.  Do suspect possible right hand fracture.  Plan: - CT head, chest x-ray - labs -ecg - Patient unsure of last Tdap, ordered from triage - Cardiac monitor  Patient's presentation is most consistent with acute presentation with potential threat to life or bodily function.  The patient is on the cardiac monitor to evaluate for evidence of arrhythmia and/or significant heart rate changes.  ED course below.  Laboratory workup unremarkable, CK mildly elevated in the 400s though overall not consistent with rhabdomyolysis.  Counseled on oral hydration.  Randon potassium normal.  I am concerned about patient's history and the lack of prodrome prior to him waking up on the ground --I did recommend admission though patient declined and has decision-making capacity on my eval.  Accompanied by wife at bedside.  Understands risks of not getting an expedited eval for this episode but reassured and he will follow-up closely with his primary care doctor for reevaluation. XR R hand with small nondisplaced right first digit proximal phalanx fracture, intra-articular--thumb spica placed, orthopedic referral placed.  Prescribed low-dose Tylenol .  Patient again declines admission for workup of syncope.  Plan for close PMD follow-up.  ED return precautions in place.  Clinical Course as of 05/16/23 2014  Thu May 16, 2023  2003 XR R hand with  minimally displaced fracture of base of proximal phalanx my interpretation, radiology report below  IMPRESSION: 1. Minimally displaced intra-articular fracture at the radial aspect base of the first proximal phalanx.   [MM]    Clinical Course User Index [MM] Collis Deaner, MD     FINAL CLINICAL IMPRESSION(S) / ED DIAGNOSES   Final diagnoses:  Fall, initial encounter  Abrasion of face, initial encounter  Right hand pain  Avulsion of skin of right forearm, initial encounter  Syncope, unspecified syncope type  Nondisplaced fracture of proximal phalanx of right thumb, initial encounter for  closed fracture     Rx / DC Orders   ED Discharge Orders          Ordered    Ambulatory referral to Orthopedic Surgery       Comments: R thumb proximal phalanx fx, intraarticular   05/16/23 2008    acetaminophen  (TYLENOL ) 500 MG tablet  Every 8 hours PRN        05/16/23 2009             Note:  This document was prepared using Dragon voice recognition software and may include unintentional dictation errors.   Collis Deaner, MD 05/16/23 2015

## 2023-05-16 NOTE — ED Provider Triage Note (Signed)
 Emergency Medicine Provider Triage Evaluation Note  Paul Willis , a 68 y.o. male  was evaluated in triage.  Pt complains of syncopal episode last night, woke up today and was on the back deck had been there all night.  Unsure of how he fell or what happened.  Denies EtOH use.  States large laceration to the forehead.  Unsure of his last Tdap.  Review of Systems  Positive:  Negative:   Physical Exam  BP 135/68 (BP Location: Left Arm)   Pulse 90   Temp 97.6 F (36.4 C) (Oral)   Resp 16   Wt 71.6 kg   SpO2 100%   BMI 23.31 kg/m  Gen:   Awake, no distress   Resp:  Normal effort  MSK:   Moves extremities without difficulty  Other:    Medical Decision Making  Medically screening exam initiated at 1:12 PM.  Appropriate orders placed.  Trevon Kagel was informed that the remainder of the evaluation will be completed by another provider, this initial triage assessment does not replace that evaluation, and the importance of remaining in the ED until their evaluation is complete.     Delsie Figures, PA-C 05/16/23 1315

## 2023-05-16 NOTE — ED Triage Notes (Signed)
 Patient states he fell asleep in his chair last night watching TV, go tup to eat peaches, went back to chair, up again got some ice cream.  Got up for a cigarette, woke up outside on wood deck outside. Has no memory of falling. Woke up, bleeding from forehead.  AAOx3. Skin warm and dry. NAD. Ambulatory. NAD

## 2023-05-16 NOTE — Discharge Instructions (Addendum)
 Your evaluation in the emergency department was notable for a fracture of your right thumb, and this was placed in a splint.  I placed an referral for you to follow-up in orthopedic clinic--they will contact you to schedule an appointment.  You can use Tylenol  and Motrin as needed for any recurrent discomfort.  Please do follow-up with your primary care doctor as well for ongoing evaluation of your likely syncopal episode.  Turn to the emergency department with any new or worsening symptoms.

## 2023-05-20 ENCOUNTER — Telehealth: Payer: Self-pay

## 2023-05-20 NOTE — Telephone Encounter (Signed)
 Recv'd referral-tried reaching patient to schedule an appointment at [facility] -however his mailbox has not been set-up -unable to leave message.

## 2023-07-31 ENCOUNTER — Other Ambulatory Visit: Payer: Self-pay | Admitting: Gastroenterology

## 2023-07-31 DIAGNOSIS — K7460 Unspecified cirrhosis of liver: Secondary | ICD-10-CM

## 2023-08-05 ENCOUNTER — Ambulatory Visit
Admission: RE | Admit: 2023-08-05 | Discharge: 2023-08-05 | Disposition: A | Discharge status: Home / Self Care | Source: Ambulatory Visit | Attending: Gastroenterology | Admitting: Gastroenterology

## 2023-08-05 ENCOUNTER — Other Ambulatory Visit: Payer: Self-pay | Admitting: Gastroenterology

## 2023-08-05 DIAGNOSIS — K7460 Unspecified cirrhosis of liver: Secondary | ICD-10-CM

## 2023-08-05 DIAGNOSIS — R188 Other ascites: Secondary | ICD-10-CM | POA: Diagnosis present

## 2023-08-06 ENCOUNTER — Other Ambulatory Visit: Payer: Self-pay | Admitting: Gastroenterology

## 2023-08-06 DIAGNOSIS — K7031 Alcoholic cirrhosis of liver with ascites: Secondary | ICD-10-CM

## 2023-09-11 ENCOUNTER — Encounter: Payer: Self-pay | Admitting: Gastroenterology

## 2023-09-12 ENCOUNTER — Ambulatory Visit: Admitting: Anesthesiology

## 2023-09-12 ENCOUNTER — Other Ambulatory Visit: Payer: Self-pay

## 2023-09-12 ENCOUNTER — Ambulatory Visit
Admission: RE | Admit: 2023-09-12 | Discharge: 2023-09-12 | Disposition: A | Discharge status: Home / Self Care | Attending: Gastroenterology | Admitting: Gastroenterology

## 2023-09-12 ENCOUNTER — Encounter: Payer: Self-pay | Admitting: Gastroenterology

## 2023-09-12 ENCOUNTER — Encounter
Admission: RE | Disposition: A | Discharge status: Home / Self Care | Payer: Self-pay | Source: Home / Self Care | Attending: Gastroenterology

## 2023-09-12 DIAGNOSIS — I252 Old myocardial infarction: Secondary | ICD-10-CM | POA: Diagnosis not present

## 2023-09-12 DIAGNOSIS — F17210 Nicotine dependence, cigarettes, uncomplicated: Secondary | ICD-10-CM | POA: Insufficient documentation

## 2023-09-12 DIAGNOSIS — I2510 Atherosclerotic heart disease of native coronary artery without angina pectoris: Secondary | ICD-10-CM | POA: Diagnosis not present

## 2023-09-12 DIAGNOSIS — J4489 Other specified chronic obstructive pulmonary disease: Secondary | ICD-10-CM | POA: Insufficient documentation

## 2023-09-12 DIAGNOSIS — Z1211 Encounter for screening for malignant neoplasm of colon: Secondary | ICD-10-CM | POA: Insufficient documentation

## 2023-09-12 HISTORY — DX: Atherosclerotic heart disease of native coronary artery without angina pectoris: I25.10

## 2023-09-12 HISTORY — DX: Thrombocytopenia, unspecified: D69.6

## 2023-09-12 HISTORY — PX: COLONOSCOPY: SHX5424

## 2023-09-12 SURGERY — COLONOSCOPY
Anesthesia: General

## 2023-09-12 MED ORDER — MIDAZOLAM HCL 2 MG/2ML IJ SOLN
INTRAMUSCULAR | Status: AC
  Filled 2023-09-12: qty 2

## 2023-09-12 MED ORDER — PROPOFOL 500 MG/50ML IV EMUL
INTRAVENOUS | Status: DC | PRN
  Administered 2023-09-12: 165 ug/kg/min via INTRAVENOUS

## 2023-09-12 MED ORDER — MIDAZOLAM HCL 2 MG/2ML IJ SOLN
INTRAMUSCULAR | Status: DC | PRN
  Administered 2023-09-12: 2 mg via INTRAVENOUS

## 2023-09-12 MED ORDER — GLYCOPYRROLATE 0.2 MG/ML IJ SOLN
INTRAMUSCULAR | Status: DC | PRN
  Administered 2023-09-12: .2 mg via INTRAVENOUS

## 2023-09-12 MED ORDER — LIDOCAINE HCL (CARDIAC) PF 100 MG/5ML IV SOSY
PREFILLED_SYRINGE | INTRAVENOUS | Status: DC | PRN
  Administered 2023-09-12: 100 mg via INTRAVENOUS

## 2023-09-12 MED ORDER — PROPOFOL 10 MG/ML IV BOLUS
INTRAVENOUS | Status: DC | PRN
  Administered 2023-09-12: 60 mg via INTRAVENOUS

## 2023-09-12 MED ORDER — SODIUM CHLORIDE 0.9 % IV SOLN: INTRAVENOUS | Status: DC

## 2023-09-12 NOTE — Op Note (Signed)
 [facility] Gastroenterology Patient Name: Paul Willis Procedure Date: 09/12/2023 8:11 AM MRN: [id number] Account #: [id number] Date of Birth: [birth year] Admit Type: Outpatient Age: 68 Room: [facility] ENDO ROOM 2 Gender: Male Note Status: Finalized Instrument Name: Colon Scope [id number] Procedure:             Colonoscopy Indications:           Screening for colorectal malignant neoplasm Providers:             [name] MD, MD Referring MD:          Alda Carpen (Referring MD) Medicines:             Monitored Anesthesia Care Complications:         No immediate complications. Procedure:             Pre-Anesthesia Assessment:                        - Prior to the procedure, a History and Physical was                         performed, and patient medications, allergies and                         sensitivities were reviewed. The patient's tolerance                         of previous anesthesia was reviewed.                        - The risks and benefits of the procedure and the                         sedation options and risks were discussed with the                         patient. All questions were answered and informed                         consent was obtained.                        - ASA Grade Assessment: II - A patient with mild                         systemic disease.                        After obtaining informed consent, the colonoscope was                         passed under direct vision. Throughout the procedure,                         the patient's blood pressure, pulse, and oxygen                         saturations were monitored continuously. The                         Colonoscope was introduced  through the anus with the                         intention of advancing to the cecum. The scope was                         advanced to the sigmoid colon before the procedure was                         aborted. Medications were given. The  colonoscopy was                         performed with ease. The patient tolerated the                         procedure well. The quality of the bowel preparation                         was inadequate. Findings:      A large amount of stool was found in the recto-sigmoid colon,       interfering with visualization. Impression:            - Preparation of the colon was inadequate.                        - Stool in the recto-sigmoid colon.                        - No specimens collected. Recommendation:        - Discharge patient to home (with escort).                        - Resume previous diet.                        - Continue present medications.                        - Repeat colonoscopy in 2 weeks because the bowel                         preparation was suboptimal. Procedure Code(s):     --- Professional ---                        [phone], 53, Colonoscopy, flexible; diagnostic,                         including collection of specimen(s) by brushing or                         washing, when performed (separate procedure) Diagnosis Code(s):     --- Professional ---                        Z12.11, Encounter for screening for malignant neoplasm                         of colon CPT copyright 2022 [employer]. All rights reserved. The codes documented in this report are preliminary  and upon coder review may  be revised to meet current compliance requirements. [name], MD [name] MD, MD 09/12/2023 8:23:16 AM This report has been signed electronically. Number of Addenda: 0 Note Initiated On: 09/12/2023 8:11 AM Total Procedure Duration: 0 hours 1 minute 14 seconds  Estimated Blood Loss:  Estimated blood loss: none.      [facility]

## 2023-09-12 NOTE — Anesthesia Preprocedure Evaluation (Addendum)
 Anesthesia Evaluation  Patient identified by MRN, date of birth, ID band Patient awake    Reviewed: Allergy & Precautions, NPO status , Patient's Chart, lab work & pertinent test results  History of Anesthesia Complications Negative for: history of anesthetic complications  Airway Mallampati: III  TM Distance: >3 FB Neck ROM: full    Dental no notable dental hx.    Pulmonary asthma , COPD, Current Smoker and Patient abstained from smoking.   Pulmonary exam normal        Cardiovascular + CAD and + Past MI  Normal cardiovascular exam     Neuro/Psych  Neuromuscular disease  negative psych ROS   GI/Hepatic negative GI ROS,,,(+)     substance abuse  alcohol use  Endo/Other  negative endocrine ROS    Renal/GU negative Renal ROS  negative genitourinary   Musculoskeletal   Abdominal   Peds  Hematology negative hematology ROS (+)   Anesthesia Other Findings Past Medical History: No date: Acute MI (HCC) No date: Alcohol-induced polyneuropathy (HCC) No date: Alcoholic cirrhosis of liver with ascites (HCC) No date: Alcoholism (HCC) No date: Asthma No date: Coronary artery disease No date: Elevated IgE level No date: Thrombocytopenia (HCC)  Past Surgical History: No date: CARDIAC SURGERY 01/19/2021: ESOPHAGOGASTRODUODENOSCOPY (EGD) WITH PROPOFOL ; N/A     Comment:  Procedure: ESOPHAGOGASTRODUODENOSCOPY (EGD) WITH               PROPOFOL ;  Surgeon: Therisa Bi, MD;  Location: [facility]               ENDOSCOPY;  Service: Gastroenterology;  Laterality: N/A; 01/17/2022: ESOPHAGOGASTRODUODENOSCOPY (EGD) WITH PROPOFOL ; N/A     Comment:  Procedure: ESOPHAGOGASTRODUODENOSCOPY (EGD) WITH               PROPOFOL ;  Surgeon: Therisa Bi, MD;  Location: [facility]               ENDOSCOPY;  Service: Gastroenterology;  Laterality: N/A; No date: HERNIA REPAIR; Bilateral     Comment:  age 68     Reproductive/Obstetrics negative OB ROS                               Anesthesia Physical Anesthesia Plan  ASA: 3  Anesthesia Plan: General   Post-op Pain Management: Minimal or no pain anticipated   Induction: Intravenous  PONV Risk Score and Plan: 1 and Propofol  infusion and TIVA  Airway Management Planned: Natural Airway and Nasal Cannula  Additional Equipment:   Intra-op Plan:   Post-operative Plan:   Informed Consent: I have reviewed the patients History and Physical, chart, labs and discussed the procedure including the risks, benefits and alternatives for the proposed anesthesia with the patient or authorized representative who has indicated his/her understanding and acceptance.     Dental Advisory Given  Plan Discussed with: Anesthesiologist, CRNA and Surgeon  Anesthesia Plan Comments: (Patient consented for risks of anesthesia including but not limited to:  - adverse reactions to medications - risk of airway placement if required - damage to eyes, teeth, lips or other oral mucosa - nerve damage due to positioning  - sore throat or hoarseness - Damage to heart, brain, nerves, lungs, other parts of body or loss of life  Patient voiced understanding and assent.)         Anesthesia Quick Evaluation

## 2023-09-12 NOTE — Anesthesia Procedure Notes (Signed)
 Procedure Name: General with mask airway Date/Time: 09/12/2023 8:18 AM  Performed by: Ledora Duncan, CRNAPre-anesthesia Checklist: Patient identified, Emergency Drugs available, Suction available and Patient being monitored Patient Re-evaluated:Patient Re-evaluated prior to induction Oxygen Delivery Method: Simple face mask Induction Type: IV induction Placement Confirmation: positive ETCO2 and breath sounds checked- equal and bilateral Dental Injury: Teeth and Oropharynx as per pre-operative assessment

## 2023-09-12 NOTE — H&P (Signed)
 Paul Willis , MD [address], [place], [place], [address] Phone: [phone] Fax: [phone]  Primary Care Physician:  Alla Amis, MD   Pre-Procedure History & Physical: HPI:  Paul Willis is a 68 y.o. male is here for an colonoscopy.   Past Medical History:  Diagnosis Date   Acute MI (HCC)    Alcohol-induced polyneuropathy (HCC)    Alcoholic cirrhosis of liver with ascites (HCC)    Alcoholism (HCC)    Asthma    Coronary artery disease    Elevated IgE level    Thrombocytopenia (HCC)     Past Surgical History:  Procedure Laterality Date   CARDIAC SURGERY     ESOPHAGOGASTRODUODENOSCOPY (EGD) WITH PROPOFOL  N/A 01/19/2021   Procedure: ESOPHAGOGASTRODUODENOSCOPY (EGD) WITH PROPOFOL ;  Surgeon: Willis Ruel, MD;  Location: [facility] ENDOSCOPY;  Service: Gastroenterology;  Laterality: N/A;   ESOPHAGOGASTRODUODENOSCOPY (EGD) WITH PROPOFOL  N/A 01/17/2022   Procedure: ESOPHAGOGASTRODUODENOSCOPY (EGD) WITH PROPOFOL ;  Surgeon: Willis Ruel, MD;  Location: [facility] ENDOSCOPY;  Service: Gastroenterology;  Laterality: N/A;   HERNIA REPAIR Bilateral    age 31    Prior to Admission medications   Medication Sig Start Date End Date Taking? Authorizing Provider  eplerenone  (INSPRA ) 25 MG tablet TAKE 2 TABLETS BY MOUTH IN THE MORNING AND 3 TABLETS IN THE EVENING EVERY DAY 02/05/23  Yes Willis Ruel, MD  folic acid  (FOLVITE ) 1 MG tablet Take 1 tablet (1 mg total) by mouth daily. 02/05/23  Yes Willis Ruel, MD  furosemide  (LASIX ) 40 MG tablet Take 1 tablet (40 mg total) by mouth daily. 02/05/23  Yes Willis Ruel, MD  gabapentin  (NEURONTIN ) 600 MG tablet Take 600 mg by mouth 2 (two) times daily.   Yes [provider]  lactulose  (CHRONULAC ) 10 GM/15ML solution Take 15 mLs (10 g total) by mouth 2 (two) times daily. 02/05/23  Yes Willis Ruel, MD  predniSONE  (DELTASONE )  10 MG tablet Take 10 mg by mouth. 04/11/22  Yes [provider]  acetaminophen  (TYLENOL ) 500 MG tablet Take 1 tablet (500 mg total) by mouth every 8 (eight) hours as needed. 05/16/23 05/15/24  Paul Ozell LABOR, MD    Allergies as of 08/08/2023 - Review Complete 05/16/2023  Allergen Reaction Noted   Albumin  (human) Hives, Itching, and Rash 11/21/2021    Family History  Problem Relation Age of Onset   Lung cancer Mother    Stroke Father    Cancer Maternal Uncle    Cancer Paternal Aunt     Social History   Socioeconomic History   Marital status: Single    Spouse name: Not on file   Number of children: Not on file   Years of education: Not on file   Highest education level: Not on file  Occupational History   Not on file  Tobacco Use   Smoking status: Every Day    Current packs/day: 0.50    Types: Cigarettes    Passive exposure: Never   Smokeless tobacco: Never  Vaping Use   Vaping status: Never Used  Substance and Sexual Activity   Alcohol use: Not Currently    Comment: 1 yr sober   Drug use: Never   Sexual activity: Not on file  Other Topics Concern   Not on file  Social History Narrative   Not on file   Social Drivers of Health   Financial Resource Strain: Medium Risk (08/06/2023)   Received from [facility]   Overall Financial Resource Strain (CARDIA)  Difficulty of Paying Living Expenses: Somewhat hard  Food Insecurity: Food Insecurity Present (08/06/2023)   Received from [facility]   Hunger Vital Sign    Within the past 12 months, you worried that your food would run out before you got the money to buy more.: Often true    Within the past 12 months, the food you bought just didn't last and you didn't have money to get more.: Often true  Transportation Needs: No Transportation Needs (08/06/2023)   Received from [facility] - Transportation    In the past 12 months, has lack of transportation  kept you from medical appointments or from getting medications?: No    Lack of Transportation (Non-Medical): No  Physical Activity: Not on file  Stress: Not on file  Social Connections: Not on file  Intimate Partner Violence: Not At Risk (11/17/2021)   Humiliation, Afraid, Rape, and Kick questionnaire    Fear of Current or Ex-Partner: No    Emotionally Abused: No    Physically Abused: No    Sexually Abused: No    Review of Systems: See HPI, otherwise negative ROS  Physical Exam: BP 126/76   Pulse 66   Temp (!) 96.9 F (36.1 C) (Temporal)   Resp 16   Ht 5' 9 (1.753 m)   Wt 70.5 kg   SpO2 100%   BMI 22.95 kg/m  General:   Alert,  pleasant and cooperative in NAD Head:  Normocephalic and atraumatic. Neck:  Supple; no masses or thyromegaly. Lungs:  Clear throughout to auscultation, normal respiratory effort.    Heart:  +S1, +S2, Regular rate and rhythm, No edema. Abdomen:  Soft, nontender and nondistended. Normal bowel sounds, without guarding, and without rebound.   Neurologic:  Alert and  oriented x4;  grossly normal neurologically.  Impression/Plan: Paul Willis is here for an colonoscopy to be performed for Screening colonoscopy average risk   Risks, benefits, limitations, and alternatives regarding  colonoscopy have been reviewed with the patient.  Questions have been answered.  All parties agreeable.   Paul Kung, MD  09/12/2023, 7:45 AM

## 2023-09-12 NOTE — Anesthesia Postprocedure Evaluation (Signed)
 Anesthesia Post Note  Patient: Norleen Raisin  Procedure(s) Performed: COLONOSCOPY  Patient location during evaluation: Endoscopy Anesthesia Type: General Level of consciousness: awake and alert Pain management: pain level controlled Vital Signs Assessment: post-procedure vital signs reviewed and stable Respiratory status: spontaneous breathing, nonlabored ventilation, respiratory function stable and patient connected to nasal cannula oxygen Cardiovascular status: blood pressure returned to baseline and stable Postop Assessment: no apparent nausea or vomiting Anesthetic complications: no   No notable events documented.   Last Vitals:  Vitals:   09/12/23 0835 09/12/23 0843  BP: 100/65 106/79  Pulse: 76 74  Resp: 16 16  Temp:    SpO2: 100% 100%    Last Pain:  Vitals:   09/12/23 0843  TempSrc:   PainSc: 0-No pain                 Lendia LITTIE Mae

## 2023-09-12 NOTE — Transfer of Care (Signed)
 Immediate Anesthesia Transfer of Care Note  Patient: Paul Willis  Procedure(s) Performed: COLONOSCOPY  Patient Location: Endoscopy Unit  Anesthesia Type:General  Level of Consciousness: drowsy and patient cooperative  Airway & Oxygen Therapy: Patient Spontanous Breathing and Patient connected to face mask oxygen  Post-op Assessment: Report given to RN and Post -op Vital signs reviewed and stable  Post vital signs: Reviewed and stable  Last Vitals:  Vitals Value Taken Time  BP    Temp    Pulse    Resp    SpO2      Last Pain:  Vitals:   09/12/23 0733  TempSrc: Temporal  PainSc: 0-No pain         Complications: No notable events documented.

## 2023-11-21 ENCOUNTER — Ambulatory Visit

## 2023-11-21 ENCOUNTER — Encounter: Payer: Self-pay | Admitting: Gastroenterology

## 2023-11-21 ENCOUNTER — Encounter
Admission: RE | Disposition: A | Discharge status: Home / Self Care | Payer: Self-pay | Source: Home / Self Care | Attending: Gastroenterology

## 2023-11-21 ENCOUNTER — Ambulatory Visit
Admission: RE | Admit: 2023-11-21 | Discharge: 2023-11-21 | Disposition: A | Discharge status: Home / Self Care | Attending: Gastroenterology | Admitting: Gastroenterology

## 2023-11-21 DIAGNOSIS — K621 Rectal polyp: Secondary | ICD-10-CM | POA: Diagnosis not present

## 2023-11-21 DIAGNOSIS — Z59868 Other specified financial insecurity: Secondary | ICD-10-CM | POA: Diagnosis not present

## 2023-11-21 DIAGNOSIS — Z5941 Food insecurity: Secondary | ICD-10-CM | POA: Diagnosis not present

## 2023-11-21 DIAGNOSIS — Z1211 Encounter for screening for malignant neoplasm of colon: Secondary | ICD-10-CM | POA: Insufficient documentation

## 2023-11-21 DIAGNOSIS — D122 Benign neoplasm of ascending colon: Secondary | ICD-10-CM | POA: Insufficient documentation

## 2023-11-21 DIAGNOSIS — F17210 Nicotine dependence, cigarettes, uncomplicated: Secondary | ICD-10-CM | POA: Diagnosis not present

## 2023-11-21 HISTORY — PX: HEMOSTASIS CLIP PLACEMENT: SHX6857

## 2023-11-21 HISTORY — PX: COLONOSCOPY: SHX5424

## 2023-11-21 HISTORY — PX: POLYPECTOMY: SHX149

## 2023-11-21 SURGERY — COLONOSCOPY
Anesthesia: General

## 2023-11-21 MED ORDER — PROPOFOL 10 MG/ML IV BOLUS
INTRAVENOUS | Status: DC | PRN
  Administered 2023-11-21: 100 mg via INTRAVENOUS

## 2023-11-21 MED ORDER — PROPOFOL 10 MG/ML IV BOLUS
INTRAVENOUS | Status: AC
Start: 2023-11-21 — End: 2023-11-21
  Filled 2023-11-21: qty 20

## 2023-11-21 MED ORDER — SODIUM CHLORIDE 0.9 % IV SOLN
INTRAVENOUS | Status: DC
  Administered 2023-11-21: 500 mL via INTRAVENOUS

## 2023-11-21 MED ORDER — DEXMEDETOMIDINE HCL IN NACL 80 MCG/20ML IV SOLN
INTRAVENOUS | Status: DC | PRN
Start: 2023-11-21 — End: 2023-11-21
  Administered 2023-11-21: 8 ug via INTRAVENOUS

## 2023-11-21 MED ORDER — LIDOCAINE HCL (PF) 2 % IJ SOLN
INTRAMUSCULAR | Status: AC
Start: 2023-11-21 — End: 2023-11-21
  Filled 2023-11-21: qty 5

## 2023-11-21 MED ORDER — PROPOFOL 500 MG/50ML IV EMUL
INTRAVENOUS | Status: DC | PRN
Start: 2023-11-21 — End: 2023-11-21
  Administered 2023-11-21: 150 ug/kg/min via INTRAVENOUS

## 2023-11-21 MED ORDER — LIDOCAINE HCL (CARDIAC) PF 100 MG/5ML IV SOSY
PREFILLED_SYRINGE | INTRAVENOUS | Status: DC | PRN
  Administered 2023-11-21: 50 mg via INTRAVENOUS

## 2023-11-21 NOTE — H&P (Signed)
 [name] , MD [address], [address], [place], [place], [address] Phone: [phone] Fax: [phone]  Primary Care Physician:  Alla Amis, MD   Pre-Procedure History & Physical: HPI:  Paul Willis is a 68 y.o. male is here for an colonoscopy.   Past Medical History:  Diagnosis Date   Acute MI (HCC)    Alcohol-induced polyneuropathy    Alcoholic cirrhosis of liver with ascites (HCC)    Alcoholism (HCC)    Asthma    Coronary artery disease    Elevated IgE level    Thrombocytopenia     Past Surgical History:  Procedure Laterality Date   CARDIAC SURGERY     COLONOSCOPY N/A 09/12/2023   Procedure: COLONOSCOPY;  Surgeon: Kung Ruel, MD;  Location: [facility] ENDOSCOPY;  Service: Gastroenterology;  Laterality: N/A;  call friend susan [phone]   ESOPHAGOGASTRODUODENOSCOPY (EGD) WITH PROPOFOL  N/A 01/19/2021   Procedure: ESOPHAGOGASTRODUODENOSCOPY (EGD) WITH PROPOFOL ;  Surgeon: Kung Ruel, MD;  Location: [facility] ENDOSCOPY;  Service: Gastroenterology;  Laterality: N/A;   ESOPHAGOGASTRODUODENOSCOPY (EGD) WITH PROPOFOL  N/A 01/17/2022   Procedure: ESOPHAGOGASTRODUODENOSCOPY (EGD) WITH PROPOFOL ;  Surgeon: Kung Ruel, MD;  Location: [facility] ENDOSCOPY;  Service: Gastroenterology;  Laterality: N/A;   HERNIA REPAIR Bilateral    age 44    Prior to Admission medications   Medication Sig Start Date End Date Taking? Authorizing Provider  acetaminophen  (TYLENOL ) 500 MG tablet Take 1 tablet (500 mg total) by mouth every 8 (eight) hours as needed. 05/16/23 05/15/24  Clarine Ozell LABOR, MD  eplerenone  (INSPRA ) 25 MG tablet TAKE 2 TABLETS BY MOUTH IN THE MORNING AND 3 TABLETS IN THE EVENING EVERY DAY 02/05/23   Kung Ruel, MD  folic acid  (FOLVITE ) 1 MG tablet Take 1 tablet (1 mg total) by mouth daily. 02/05/23   Kung Ruel, MD  furosemide  (LASIX ) 40 MG tablet Take 1 tablet (40 mg  total) by mouth daily. 02/05/23   Kung Ruel, MD  gabapentin  (NEURONTIN ) 600 MG tablet Take 600 mg by mouth 2 (two) times daily.    [provider]  lactulose  (CHRONULAC ) 10 GM/15ML solution Take 15 mLs (10 g total) by mouth 2 (two) times daily. 02/05/23   Kung Ruel, MD  predniSONE  (DELTASONE ) 10 MG tablet Take 10 mg by mouth. 04/11/22   [provider]    Allergies as of 11/12/2023 - Review Complete 09/12/2023  Allergen Reaction Noted   Albumin  (human) Hives, Itching, and Rash 11/21/2021    Family History  Problem Relation Age of Onset   Lung cancer Mother    Stroke Father    Cancer Maternal Uncle    Cancer Paternal Aunt     Social History   Socioeconomic History   Marital status: Single    Spouse name: Not on file   Number of children: Not on file   Years of education: Not on file   Highest education level: Not on file  Occupational History   Not on file  Tobacco Use   Smoking status: Every Day    Current packs/day: 0.50    Types: Cigarettes    Passive exposure: Never   Smokeless tobacco: Never  Vaping Use   Vaping status: Never Used  Substance and Sexual Activity   Alcohol use: Not Currently    Comment: 1 yr sober   Drug use: Never   Sexual activity: Not on file  Other Topics Concern   Not on file  Social History Narrative   Not on file   Social Drivers of  Health   Financial Resource Strain: Medium Risk (08/06/2023)   Received from [facility]   Overall Financial Resource Strain (CARDIA)    Difficulty of Paying Living Expenses: Somewhat hard  Food Insecurity: Food Insecurity Present (08/06/2023)   Received from [facility]   Hunger Vital Sign    Within the past 12 months, you worried that your food would run out before you got the money to buy more.: Often true    Within the past 12 months, the food you bought just didn't last and you didn't have money to get more.: Often true  Transportation Needs: No  Transportation Needs (08/06/2023)   Received from [facility] - Transportation    In the past 12 months, has lack of transportation kept you from medical appointments or from getting medications?: No    Lack of Transportation (Non-Medical): No  Physical Activity: Not on file  Stress: Not on file  Social Connections: Not on file  Intimate Partner Violence: Not At Risk (11/17/2021)   Humiliation, Afraid, Rape, and Kick questionnaire    Fear of Current or Ex-Partner: No    Emotionally Abused: No    Physically Abused: No    Sexually Abused: No    Review of Systems: See HPI, otherwise negative ROS  Physical Exam: There were no vitals taken for this visit. General:   Alert,  pleasant and cooperative in NAD Head:  Normocephalic and atraumatic. Neck:  Supple; no masses or thyromegaly. Lungs:  Clear throughout to auscultation, normal respiratory effort.    Heart:  +S1, +S2, Regular rate and rhythm, No edema. Abdomen:  Soft, nontender and nondistended. Normal bowel sounds, without guarding, and without rebound.   Neurologic:  Alert and  oriented x4;  grossly normal neurologically.  Impression/Plan: Paul Willis is here for an colonoscopy to be performed for Screening colonoscopy average risk   Risks, benefits, limitations, and alternatives regarding  colonoscopy have been reviewed with the patient.  Questions have been answered.  All parties agreeable.   [name], MD  11/21/2023, 9:33 AM

## 2023-11-21 NOTE — Transfer of Care (Signed)
 Immediate Anesthesia Transfer of Care Note  Patient: Paul Willis  Procedure(s) Performed: COLONOSCOPY POLYPECTOMY, INTESTINE CONTROL OF HEMORRHAGE, GI TRACT, ENDOSCOPIC, BY CLIPPING OR OVERSEWING  Patient Location: PACU  Anesthesia Type:General  Level of Consciousness: awake, alert , oriented, and patient cooperative  Airway & Oxygen Therapy: Patient Spontanous Breathing and Patient connected to nasal cannula oxygen  Post-op Assessment: Report given to RN, Post -op Vital signs reviewed and stable, and Patient moving all extremities X 4  Post vital signs: Reviewed and stable  Last Vitals:  Vitals Value Taken Time  BP 90/62 11/21/23 10:49  Temp 35.9 C 11/21/23 10:48  Pulse 79 11/21/23 10:49  Resp 15 11/21/23 10:49  SpO2 100 % 11/21/23 10:49  Vitals shown include unfiled device data.  Last Pain:  Vitals:   11/21/23 1048  TempSrc: Temporal  PainSc: 0-No pain         Complications: No notable events documented.

## 2023-11-21 NOTE — Anesthesia Postprocedure Evaluation (Signed)
 Anesthesia Post Note  Patient: Paul Willis  Procedure(s) Performed: COLONOSCOPY POLYPECTOMY, INTESTINE CONTROL OF HEMORRHAGE, GI TRACT, ENDOSCOPIC, BY CLIPPING OR OVERSEWING  Patient location during evaluation: PACU Anesthesia Type: General Level of consciousness: awake and alert Pain management: pain level controlled Vital Signs Assessment: post-procedure vital signs reviewed and stable Respiratory status: spontaneous breathing, nonlabored ventilation, respiratory function stable and patient connected to nasal cannula oxygen Cardiovascular status: blood pressure returned to baseline and stable Postop Assessment: no apparent nausea or vomiting Anesthetic complications: no   No notable events documented.   Last Vitals:  Vitals:   11/21/23 1058 11/21/23 1109  BP: 110/64 99/60  Pulse: 75 74  Resp: 18 14  Temp:    SpO2: 100% 100%    Last Pain:  Vitals:   11/21/23 1109  TempSrc:   PainSc: 0-No pain                 Lynwood KANDICE Clause

## 2023-11-21 NOTE — Op Note (Signed)
 [facility] Patient Name: Paul Willis Procedure Date: 11/21/2023 10:16 AM MRN: [id number] Account #: [id number] Date of Birth: 05-02-1955 Admit Type: Outpatient Age: 68 Room: [facility] ENDO ROOM 2 Gender: Male Note Status: Finalized Instrument Name: Colon Scope [id number] Procedure:             Colonoscopy Indications:           Screening for colorectal malignant neoplasm Providers:             [name] MD, MD Referring MD:          Alda Carpen (Referring MD) Medicines:             Monitored Anesthesia Care Complications:         No immediate complications. Procedure:             Pre-Anesthesia Assessment:                        - Prior to the procedure, a History and Physical was                         performed, and patient medications, allergies and                         sensitivities were reviewed. The patient's tolerance                         of previous anesthesia was reviewed.                        - The risks and benefits of the procedure and the                         sedation options and risks were discussed with the                         patient. All questions were answered and informed                         consent was obtained.                        - ASA Grade Assessment: II - A patient with mild                         systemic disease.                        After obtaining informed consent, the colonoscope was                         passed under direct vision. Throughout the procedure,                         the patient's blood pressure, pulse, and oxygen                         saturations were monitored continuously. The                         Colonoscope was introduced  through the anus and                         advanced to the the cecum, identified by the                         appendiceal orifice. The colonoscopy was performed                         with ease. The patient tolerated the procedure well.                          The quality of the bowel preparation was good. The                         ileocecal valve, appendiceal orifice, and rectum were                         photographed. Findings:      The perianal and digital rectal examinations were normal.      Five sessile polyps were found in the ascending colon. The polyps were 5       to 6 mm in size. These polyps were removed with a cold snare. Resection       was complete, but the polyp tissue was only partially retrieved. To       prevent bleeding post-intervention, one hemostatic clip was successfully       placed. Clip manufacturer: [employer]. There was no bleeding at       the end of the procedure.      Three sessile polyps were found in the rectum. The polyps were 4 to 5 mm       in size. These polyps were removed with a cold snare. Resection and       retrieval were complete. To prevent bleeding post-intervention, one       hemostatic clip was successfully placed. Clip manufacturer: [employer]. There was no bleeding at the end of the procedure.      The exam was otherwise without abnormality on direct and retroflexion       views. Impression:            - Five 5 to 6 mm polyps in the ascending colon,                         removed with a cold snare. Complete resection. Partial                         retrieval. Clip was placed. Clip manufacturer: [employer].                        - Three 4 to 5 mm polyps in the rectum, removed with a                         cold snare. Resected and retrieved. Clip was placed.  Clip manufacturer: [employer].                        - The examination was otherwise normal on direct and                         retroflexion views. Recommendation:        - Discharge patient to home (with escort).                        - Resume previous diet.                        - Continue present medications.                         - Await pathology results.                        - Repeat colonoscopy in 3 years for surveillance based                         on pathology results. Procedure Code(s):     --- Professional ---                        [phone], Colonoscopy, flexible; with removal of                         tumor(s), polyp(s), or other lesion(s) by snare                         technique Diagnosis Code(s):     --- Professional ---                        Z12.11, Encounter for screening for malignant neoplasm                         of colon                        D12.2, Benign neoplasm of ascending colon                        D12.8, Benign neoplasm of rectum CPT copyright 2022 [employer]. All rights reserved. The codes documented in this report are preliminary and upon coder review may  be revised to meet current compliance requirements. [name], MD [name] MD, MD 11/21/2023 10:47:23 AM This report has been signed electronically. Number of Addenda: 0 Note Initiated On: 11/21/2023 10:16 AM Scope Withdrawal Time: 0 hours 15 minutes 30 seconds  Total Procedure Duration: 0 hours 16 minutes 41 seconds  Estimated Blood Loss:  Estimated blood loss: none.      [facility]

## 2023-11-21 NOTE — Anesthesia Preprocedure Evaluation (Signed)
 Anesthesia Evaluation  Patient identified by MRN, date of birth, ID band Patient awake    Reviewed: Allergy & Precautions, H&P , NPO status , Patient's Chart, lab work & pertinent test results, reviewed documented beta blocker date and time   Airway Mallampati: II   Neck ROM: full    Dental  (+) Poor Dentition   Pulmonary asthma , COPD, Current Smoker and Patient abstained from smoking.   Pulmonary exam normal        Cardiovascular Exercise Tolerance: Good + CAD and + Past MI  Normal cardiovascular exam Rhythm:regular Rate:Normal     Neuro/Psych  PSYCHIATRIC DISORDERS       Neuromuscular disease    GI/Hepatic negative GI ROS, Neg liver ROS,,,  Endo/Other  negative endocrine ROS    Renal/GU negative Renal ROS  negative genitourinary   Musculoskeletal   Abdominal   Peds  Hematology negative hematology ROS (+)   Anesthesia Other Findings Past Medical History: No date: Acute MI (HCC) No date: Alcohol-induced polyneuropathy No date: Alcoholic cirrhosis of liver with ascites (HCC) No date: Alcoholism (HCC) No date: Asthma No date: Coronary artery disease No date: Elevated IgE level No date: Thrombocytopenia Past Surgical History: No date: CARDIAC CATHETERIZATION No date: CARDIAC SURGERY 09/12/2023: COLONOSCOPY; N/A     Comment:  Procedure: COLONOSCOPY;  Surgeon: Therisa Bi, MD;                Location: [facility] ENDOSCOPY;  Service: Gastroenterology;                Laterality: N/A;  call friend susan [phone] 01/19/2021: ESOPHAGOGASTRODUODENOSCOPY (EGD) WITH PROPOFOL ; N/A     Comment:  Procedure: ESOPHAGOGASTRODUODENOSCOPY (EGD) WITH               PROPOFOL ;  Surgeon: Therisa Bi, MD;  Location: [facility]               ENDOSCOPY;  Service: Gastroenterology;  Laterality: N/A; 01/17/2022: ESOPHAGOGASTRODUODENOSCOPY (EGD) WITH PROPOFOL ; N/A     Comment:  Procedure: ESOPHAGOGASTRODUODENOSCOPY (EGD) WITH                PROPOFOL ;  Surgeon: Therisa Bi, MD;  Location: [facility]               ENDOSCOPY;  Service: Gastroenterology;  Laterality: N/A; No date: HERNIA REPAIR; Bilateral     Comment:  age 68 BMI    Body Mass Index: 23.57 kg/m     Reproductive/Obstetrics negative OB ROS                              Anesthesia Physical Anesthesia Plan  ASA: 3  Anesthesia Plan: General   Post-op Pain Management:    Induction:   PONV Risk Score and Plan:   Airway Management Planned:   Additional Equipment:   Intra-op Plan:   Post-operative Plan:   Informed Consent: I have reviewed the patients History and Physical, chart, labs and discussed the procedure including the risks, benefits and alternatives for the proposed anesthesia with the patient or authorized representative who has indicated his/her understanding and acceptance.     Dental Advisory Given  Plan Discussed with: CRNA  Anesthesia Plan Comments:         Anesthesia Quick Evaluation

## 2023-11-22 LAB — SURGICAL PATHOLOGY
